# Patient Record
Sex: Female | Born: 1940 | Race: White | Hispanic: No | Marital: Married | Smoking: Former smoker
Health system: Southern US, Community
[De-identification: ages and names within clinical notes are randomized; demographics above are authoritative.]

## PROBLEM LIST (undated history)

## (undated) DIAGNOSIS — I10 Essential (primary) hypertension: Secondary | ICD-10-CM

## (undated) DIAGNOSIS — E119 Type 2 diabetes mellitus without complications: Secondary | ICD-10-CM

---

## 2007-08-20 ENCOUNTER — Ambulatory Visit: Payer: Self-pay | Admitting: Gastroenterology

## 2007-09-15 ENCOUNTER — Other Ambulatory Visit: Payer: Self-pay

## 2007-09-15 ENCOUNTER — Inpatient Hospital Stay: Payer: Self-pay | Admitting: Specialist

## 2007-09-29 ENCOUNTER — Ambulatory Visit: Payer: Self-pay | Admitting: Vascular Surgery

## 2008-08-09 ENCOUNTER — Ambulatory Visit: Payer: Self-pay | Admitting: Gastroenterology

## 2009-05-15 ENCOUNTER — Inpatient Hospital Stay: Payer: Self-pay | Admitting: Internal Medicine

## 2009-06-01 ENCOUNTER — Inpatient Hospital Stay: Payer: Self-pay | Admitting: Internal Medicine

## 2009-06-01 ENCOUNTER — Ambulatory Visit: Payer: Self-pay

## 2009-08-05 ENCOUNTER — Inpatient Hospital Stay: Payer: Self-pay | Admitting: *Deleted

## 2009-08-17 ENCOUNTER — Inpatient Hospital Stay: Payer: Self-pay | Admitting: Internal Medicine

## 2010-05-24 ENCOUNTER — Ambulatory Visit: Payer: Self-pay | Admitting: Oncology

## 2010-06-15 ENCOUNTER — Ambulatory Visit: Payer: Self-pay | Admitting: Internal Medicine

## 2011-06-07 ENCOUNTER — Ambulatory Visit: Payer: Self-pay

## 2011-06-07 LAB — CREATININE, SERUM: EGFR (African American): 57 — ABNORMAL LOW

## 2011-06-27 ENCOUNTER — Ambulatory Visit: Payer: Self-pay | Admitting: Internal Medicine

## 2011-07-28 ENCOUNTER — Ambulatory Visit: Payer: Self-pay | Admitting: Internal Medicine

## 2011-10-16 ENCOUNTER — Encounter: Payer: Self-pay | Admitting: Nurse Practitioner

## 2011-10-16 ENCOUNTER — Encounter: Payer: Self-pay | Admitting: Cardiothoracic Surgery

## 2011-10-28 ENCOUNTER — Encounter: Payer: Self-pay | Admitting: Cardiothoracic Surgery

## 2011-10-28 ENCOUNTER — Encounter: Payer: Self-pay | Admitting: Nurse Practitioner

## 2012-04-25 ENCOUNTER — Ambulatory Visit: Payer: Self-pay | Admitting: Orthopedic Surgery

## 2012-04-25 LAB — BASIC METABOLIC PANEL
Anion Gap: 1 — ABNORMAL LOW (ref 7–16)
BUN: 15 mg/dL (ref 7–18)
Calcium, Total: 8.7 mg/dL (ref 8.5–10.1)
Chloride: 109 mmol/L — ABNORMAL HIGH (ref 98–107)
Creatinine: 0.83 mg/dL (ref 0.60–1.30)
EGFR (African American): >60
Glucose: 120 mg/dL — ABNORMAL HIGH (ref 65–99)
Osmolality: 285 (ref 275–301)
Sodium: 142 mmol/L (ref 136–145)

## 2012-06-30 ENCOUNTER — Ambulatory Visit: Payer: Self-pay

## 2012-06-30 LAB — CREATININE, SERUM
Creatinine: 1.03 mg/dL (ref 0.60–1.30)
EGFR (African American): >60
EGFR (Non-African Amer.): 55 — ABNORMAL LOW

## 2012-07-01 ENCOUNTER — Ambulatory Visit: Payer: Self-pay | Admitting: Internal Medicine

## 2012-07-27 ENCOUNTER — Ambulatory Visit: Payer: Self-pay | Admitting: Internal Medicine

## 2012-08-28 ENCOUNTER — Encounter: Payer: Self-pay | Admitting: Cardiothoracic Surgery

## 2012-08-28 ENCOUNTER — Encounter: Payer: Self-pay | Admitting: Nurse Practitioner

## 2012-09-03 LAB — WOUND CULTURE

## 2012-09-14 ENCOUNTER — Emergency Department: Payer: Self-pay | Admitting: Emergency Medicine

## 2012-09-14 LAB — URINALYSIS, COMPLETE
Bilirubin,UR: NEGATIVE
Glucose,UR: NEGATIVE mg/dL
Ketone: NEGATIVE
Leukocyte Esterase: NEGATIVE
Nitrite: POSITIVE
Ph: 5
Protein: 30
RBC,UR: NONE SEEN /HPF
Specific Gravity: 1.021
Squamous Epithelial: 1
WBC UR: <1 /HPF

## 2012-09-14 LAB — LIPASE, BLOOD: Lipase: 76 U/L (ref 73–393)

## 2012-09-14 LAB — COMPREHENSIVE METABOLIC PANEL
Albumin: 3.2 g/dL — ABNORMAL LOW (ref 3.4–5.0)
Anion Gap: 7 (ref 7–16)
Bilirubin,Total: 0.4 mg/dL (ref 0.2–1.0)
Calcium, Total: 8.9 mg/dL (ref 8.5–10.1)
Chloride: 106 mmol/L (ref 98–107)
Co2: 28 mmol/L (ref 21–32)
Creatinine: 1.19 mg/dL (ref 0.60–1.30)
EGFR (African American): 54 — ABNORMAL LOW
EGFR (Non-African Amer.): 46 — ABNORMAL LOW
Osmolality: 283 (ref 275–301)
Potassium: 4.3 mmol/L (ref 3.5–5.1)
SGOT(AST): 21 U/L (ref 15–37)

## 2012-09-14 LAB — CBC
HCT: 36 % (ref 35.0–47.0)
MCHC: 33.2 g/dL (ref 32.0–36.0)
Platelet: 200 10*3/uL (ref 150–440)
RDW: 13.5 % (ref 11.5–14.5)
WBC: 5.4 10*3/uL (ref 3.6–11.0)

## 2012-09-14 LAB — TROPONIN I: Troponin-I: <0.02 ng/mL

## 2012-09-16 LAB — URINE CULTURE

## 2012-09-26 ENCOUNTER — Encounter: Payer: Self-pay | Admitting: Cardiothoracic Surgery

## 2012-09-26 ENCOUNTER — Encounter: Payer: Self-pay | Admitting: Nurse Practitioner

## 2013-02-12 ENCOUNTER — Encounter: Payer: Self-pay | Admitting: Surgery

## 2013-02-26 ENCOUNTER — Encounter: Payer: Self-pay | Admitting: Surgery

## 2013-05-12 ENCOUNTER — Ambulatory Visit: Payer: Self-pay | Admitting: Family Medicine

## 2013-05-14 ENCOUNTER — Ambulatory Visit: Payer: Self-pay | Admitting: Physical Medicine and Rehabilitation

## 2013-05-26 ENCOUNTER — Ambulatory Visit: Payer: Self-pay | Admitting: Family Medicine

## 2013-10-21 ENCOUNTER — Encounter: Payer: Self-pay | Admitting: Surgery

## 2013-10-27 ENCOUNTER — Encounter: Payer: Self-pay | Admitting: Surgery

## 2013-11-10 LAB — WOUND AEROBIC CULTURE

## 2013-11-21 LAB — URINALYSIS, COMPLETE
BACTERIA: NONE SEEN
Bilirubin,UR: NEGATIVE
Blood: NEGATIVE
GLUCOSE, UR: NEGATIVE mg/dL (ref 0–75)
KETONE: NEGATIVE
Leukocyte Esterase: NEGATIVE
Nitrite: NEGATIVE
PH: 5 (ref 4.5–8.0)
PROTEIN: NEGATIVE
Specific Gravity: 1.028 (ref 1.003–1.030)
Squamous Epithelial: NONE SEEN

## 2013-11-21 LAB — CBC WITH DIFFERENTIAL/PLATELET
Basophil #: 0.1 10*3/uL (ref 0.0–0.1)
Basophil %: 0.6 %
EOS PCT: 2.3 %
Eosinophil #: 0.2 10*3/uL (ref 0.0–0.7)
HCT: 41.5 % (ref 35.0–47.0)
HGB: 13.4 g/dL (ref 12.0–16.0)
Lymphocyte #: 1.1 10*3/uL (ref 1.0–3.6)
Lymphocyte %: 11.4 %
MCH: 34.9 pg — ABNORMAL HIGH (ref 26.0–34.0)
MCHC: 32.2 g/dL (ref 32.0–36.0)
MCV: 109 fL — AB (ref 80–100)
Monocyte #: 0.7 x10 3/mm (ref 0.2–0.9)
Monocyte %: 7.5 %
NEUTROS ABS: 7.2 10*3/uL — AB (ref 1.4–6.5)
Neutrophil %: 78.2 %
Platelet: 209 10*3/uL (ref 150–440)
RBC: 3.83 10*6/uL (ref 3.80–5.20)
RDW: 17 % — AB (ref 11.5–14.5)
WBC: 9.2 10*3/uL (ref 3.6–11.0)

## 2013-11-21 LAB — PROTIME-INR
INR: 1
Prothrombin Time: 12.6 secs (ref 11.5–14.7)

## 2013-11-21 LAB — COMPREHENSIVE METABOLIC PANEL
ALK PHOS: 155 U/L — AB
ALT: 29 U/L
ANION GAP: 5 — AB (ref 7–16)
Albumin: 2.7 g/dL — ABNORMAL LOW (ref 3.4–5.0)
BILIRUBIN TOTAL: 0.4 mg/dL (ref 0.2–1.0)
BUN: 24 mg/dL — AB (ref 7–18)
CO2: 30 mmol/L (ref 21–32)
Calcium, Total: 8.1 mg/dL — ABNORMAL LOW (ref 8.5–10.1)
Chloride: 105 mmol/L (ref 98–107)
Creatinine: 1.49 mg/dL — ABNORMAL HIGH (ref 0.60–1.30)
EGFR (African American): 44 — ABNORMAL LOW
GFR CALC NON AF AMER: 37 — AB
Glucose: 75 mg/dL (ref 65–99)
Osmolality: 282 (ref 275–301)
POTASSIUM: 4.3 mmol/L (ref 3.5–5.1)
SGOT(AST): 34 U/L (ref 15–37)
Sodium: 140 mmol/L (ref 136–145)
TOTAL PROTEIN: 6.5 g/dL (ref 6.4–8.2)

## 2013-11-21 LAB — TROPONIN I: Troponin-I: <0.02 ng/mL

## 2013-11-21 LAB — MAGNESIUM: Magnesium: 1.5 mg/dL — ABNORMAL LOW

## 2013-11-21 LAB — PHOSPHORUS: PHOSPHORUS: 3.4 mg/dL (ref 2.5–4.9)

## 2013-11-22 ENCOUNTER — Observation Stay: Payer: Self-pay | Admitting: Internal Medicine

## 2013-11-22 LAB — TSH: Thyroid Stimulating Horm: 0.163 u[IU]/mL — ABNORMAL LOW

## 2013-11-23 LAB — BASIC METABOLIC PANEL
Anion Gap: 4 — ABNORMAL LOW (ref 7–16)
BUN: 14 mg/dL (ref 7–18)
Calcium, Total: 7.2 mg/dL — ABNORMAL LOW (ref 8.5–10.1)
Chloride: 112 mmol/L — ABNORMAL HIGH (ref 98–107)
Co2: 26 mmol/L (ref 21–32)
Creatinine: 1.04 mg/dL (ref 0.60–1.30)
EGFR (African American): >60
EGFR (Non-African Amer.): 56 — ABNORMAL LOW
Glucose: 144 mg/dL — ABNORMAL HIGH (ref 65–99)
Osmolality: 286 (ref 275–301)
Potassium: 4.5 mmol/L (ref 3.5–5.1)
Sodium: 142 mmol/L (ref 136–145)

## 2013-11-23 LAB — CBC WITH DIFFERENTIAL/PLATELET
Basophil #: 0 10*3/uL (ref 0.0–0.1)
Basophil %: 0.7 %
Eosinophil #: 0.2 10*3/uL (ref 0.0–0.7)
Eosinophil %: 4.2 %
HCT: 29.5 % — ABNORMAL LOW (ref 35.0–47.0)
HGB: 9.3 g/dL — ABNORMAL LOW (ref 12.0–16.0)
Lymphocyte #: 1.4 10*3/uL (ref 1.0–3.6)
Lymphocyte %: 28.7 %
MCH: 34.2 pg — ABNORMAL HIGH (ref 26.0–34.0)
MCHC: 31.5 g/dL — ABNORMAL LOW (ref 32.0–36.0)
MCV: 109 fL — ABNORMAL HIGH (ref 80–100)
Monocyte #: 0.4 x10 3/mm (ref 0.2–0.9)
Monocyte %: 9 %
Neutrophil #: 2.8 10*3/uL (ref 1.4–6.5)
Neutrophil %: 57.4 %
Platelet: 139 10*3/uL — ABNORMAL LOW (ref 150–440)
RBC: 2.72 10*6/uL — ABNORMAL LOW (ref 3.80–5.20)
RDW: 16.9 % — ABNORMAL HIGH (ref 11.5–14.5)
WBC: 4.9 10*3/uL (ref 3.6–11.0)

## 2013-11-23 LAB — URINE CULTURE

## 2013-11-23 LAB — IRON AND TIBC
Iron Bind.Cap.(Total): 141 ug/dL — ABNORMAL LOW (ref 250–450)
Iron Saturation: 39 %
Iron: 55 ug/dL (ref 50–170)
UNBOUND IRON-BIND. CAP.: 86 ug/dL

## 2013-11-23 LAB — FERRITIN: FERRITIN (ARMC): 64 ng/mL (ref 8–388)

## 2013-11-23 LAB — FOLATE: Folic Acid: 3.1 ng/mL (ref 3.1–100.0)

## 2013-11-23 LAB — HEMOGLOBIN A1C: Hemoglobin A1C: 5.4 % (ref 4.2–6.3)

## 2013-11-23 LAB — TSH: Thyroid Stimulating Horm: 0.433 u[IU]/mL — ABNORMAL LOW

## 2013-11-24 LAB — BASIC METABOLIC PANEL
ANION GAP: 4 — AB (ref 7–16)
BUN: 8 mg/dL (ref 7–18)
CREATININE: 0.81 mg/dL (ref 0.60–1.30)
Calcium, Total: 6.7 mg/dL — CL (ref 8.5–10.1)
Chloride: 115 mmol/L — ABNORMAL HIGH (ref 98–107)
Co2: 25 mmol/L (ref 21–32)
Glucose: 108 mg/dL — ABNORMAL HIGH (ref 65–99)
Osmolality: 286 (ref 275–301)
Potassium: 4 mmol/L (ref 3.5–5.1)
SODIUM: 144 mmol/L (ref 136–145)

## 2013-11-24 LAB — CBC WITH DIFFERENTIAL/PLATELET
BASOS ABS: 0 10*3/uL (ref 0.0–0.1)
Basophil %: 0.7 %
Eosinophil #: 0.2 10*3/uL (ref 0.0–0.7)
Eosinophil %: 3.7 %
HCT: 28.8 % — ABNORMAL LOW (ref 35.0–47.0)
HGB: 9.4 g/dL — AB (ref 12.0–16.0)
LYMPHS ABS: 1.4 10*3/uL (ref 1.0–3.6)
LYMPHS PCT: 24.9 %
MCH: 35.4 pg — ABNORMAL HIGH (ref 26.0–34.0)
MCHC: 32.8 g/dL (ref 32.0–36.0)
MCV: 108 fL — AB (ref 80–100)
MONOS PCT: 9.1 %
Monocyte #: 0.5 x10 3/mm (ref 0.2–0.9)
Neutrophil #: 3.5 10*3/uL (ref 1.4–6.5)
Neutrophil %: 61.6 %
PLATELETS: 150 10*3/uL (ref 150–440)
RBC: 2.67 10*6/uL — ABNORMAL LOW (ref 3.80–5.20)
RDW: 16.8 % — ABNORMAL HIGH (ref 11.5–14.5)
WBC: 5.7 10*3/uL (ref 3.6–11.0)

## 2013-11-26 ENCOUNTER — Encounter: Payer: Self-pay | Admitting: Surgery

## 2013-11-26 LAB — CULTURE, BLOOD (SINGLE)

## 2013-12-10 ENCOUNTER — Ambulatory Visit: Payer: Self-pay | Admitting: Family Medicine

## 2013-12-27 ENCOUNTER — Encounter: Payer: Self-pay | Admitting: Surgery

## 2014-01-26 ENCOUNTER — Encounter: Payer: Self-pay | Admitting: Surgery

## 2014-01-29 ENCOUNTER — Encounter: Payer: Self-pay | Admitting: General Surgery

## 2014-02-16 LAB — WOUND AEROBIC CULTURE

## 2014-03-01 ENCOUNTER — Inpatient Hospital Stay: Payer: Self-pay | Admitting: Internal Medicine

## 2014-03-01 LAB — URINALYSIS, COMPLETE
BILIRUBIN, UR: NEGATIVE
BILIRUBIN, UR: NEGATIVE
Bilirubin,UR: NEGATIVE
GLUCOSE, UR: NEGATIVE mg/dL (ref 0–75)
Glucose,UR: NEGATIVE mg/dL (ref 0–75)
Glucose,UR: NEGATIVE mg/dL (ref 0–75)
KETONE: NEGATIVE
Ketone: NEGATIVE
Ketone: NEGATIVE
NITRITE: NEGATIVE
NITRITE: NEGATIVE
Nitrite: NEGATIVE
Ph: 5 (ref 4.5–8.0)
Ph: 5 (ref 4.5–8.0)
Ph: 5 (ref 4.5–8.0)
Protein: NEGATIVE
Protein: 30
Protein: 30
RBC,UR: 48 /HPF (ref 0–5)
RBC,UR: 72 /HPF (ref 0–5)
SPECIFIC GRAVITY: 1.014 (ref 1.003–1.030)
Specific Gravity: 1.024 (ref 1.003–1.030)
Specific Gravity: 1.013 (ref 1.003–1.030)
Squamous Epithelial: 9
Squamous Epithelial: NONE SEEN
WBC UR: 202 /HPF (ref 0–5)

## 2014-03-01 LAB — CBC
HCT: 37 % (ref 35.0–47.0)
HGB: 11.6 g/dL — ABNORMAL LOW (ref 12.0–16.0)
MCH: 32.4 pg (ref 26.0–34.0)
MCHC: 31.4 g/dL — ABNORMAL LOW (ref 32.0–36.0)
MCV: 103 fL — AB (ref 80–100)
Platelet: 326 10*3/uL (ref 150–440)
RBC: 3.59 10*6/uL — ABNORMAL LOW (ref 3.80–5.20)
RDW: 16.1 % — ABNORMAL HIGH (ref 11.5–14.5)
WBC: 13.8 10*3/uL — ABNORMAL HIGH (ref 3.6–11.0)

## 2014-03-01 LAB — COMPREHENSIVE METABOLIC PANEL
ALK PHOS: 170 U/L — AB
ALT: 40 U/L
AST: 52 U/L — AB (ref 15–37)
Albumin: 2.2 g/dL — ABNORMAL LOW (ref 3.4–5.0)
Anion Gap: 7 (ref 7–16)
BUN: 36 mg/dL — AB (ref 7–18)
Bilirubin,Total: 0.5 mg/dL (ref 0.2–1.0)
CO2: 27 mmol/L (ref 21–32)
CREATININE: 1.4 mg/dL — AB (ref 0.60–1.30)
Calcium, Total: 8.2 mg/dL — ABNORMAL LOW (ref 8.5–10.1)
Chloride: 106 mmol/L (ref 98–107)
EGFR (African American): 48 — ABNORMAL LOW
EGFR (Non-African Amer.): 39 — ABNORMAL LOW
Glucose: 92 mg/dL (ref 65–99)
Osmolality: 287 (ref 275–301)
Potassium: 4.9 mmol/L (ref 3.5–5.1)
Sodium: 140 mmol/L (ref 136–145)
Total Protein: 6 g/dL — ABNORMAL LOW (ref 6.4–8.2)

## 2014-03-01 LAB — TROPONIN I: Troponin-I: <0.02 ng/mL

## 2014-03-02 LAB — TSH: Thyroid Stimulating Horm: 0.016 u[IU]/mL — ABNORMAL LOW

## 2014-03-02 LAB — HEMOGLOBIN A1C: Hemoglobin A1C: 5.3 % (ref 4.2–6.3)

## 2014-03-03 LAB — URINALYSIS, COMPLETE
Bilirubin,UR: NEGATIVE
GLUCOSE, UR: NEGATIVE mg/dL (ref 0–75)
KETONE: NEGATIVE
Nitrite: NEGATIVE
PROTEIN: NEGATIVE
Ph: 5 (ref 4.5–8.0)
Specific Gravity: 1.012 (ref 1.003–1.030)
Squamous Epithelial: 1

## 2014-03-03 LAB — T4, FREE: FREE THYROXINE: 1.24 ng/dL (ref 0.76–1.46)

## 2014-03-03 LAB — CREATININE, SERUM
Creatinine: 0.87 mg/dL (ref 0.60–1.30)
EGFR (African American): >60
EGFR (Non-African Amer.): >60

## 2014-03-04 LAB — CBC WITH DIFFERENTIAL/PLATELET
BASOS PCT: 0.6 %
Basophil #: 0 10*3/uL (ref 0.0–0.1)
Eosinophil #: 0.2 10*3/uL (ref 0.0–0.7)
Eosinophil %: 2.3 %
HCT: 28.5 % — AB (ref 35.0–47.0)
HGB: 9.3 g/dL — ABNORMAL LOW (ref 12.0–16.0)
Lymphocyte #: 1 10*3/uL (ref 1.0–3.6)
Lymphocyte %: 13.8 %
MCH: 33.4 pg (ref 26.0–34.0)
MCHC: 32.7 g/dL (ref 32.0–36.0)
MCV: 102 fL — AB (ref 80–100)
MONOS PCT: 11.9 %
Monocyte #: 0.9 x10 3/mm (ref 0.2–0.9)
NEUTROS ABS: 5.4 10*3/uL (ref 1.4–6.5)
Neutrophil %: 71.4 %
PLATELETS: 201 10*3/uL (ref 150–440)
RBC: 2.79 10*6/uL — ABNORMAL LOW (ref 3.80–5.20)
RDW: 15.4 % — AB (ref 11.5–14.5)
WBC: 7.5 10*3/uL (ref 3.6–11.0)

## 2014-03-04 LAB — BASIC METABOLIC PANEL WITH GFR
Anion Gap: 6 — ABNORMAL LOW
BUN: 13 mg/dL
Calcium, Total: 8 mg/dL — ABNORMAL LOW
Chloride: 110 mmol/L — ABNORMAL HIGH
Co2: 26 mmol/L
Creatinine: 0.95 mg/dL
EGFR (African American): >60
EGFR (Non-African Amer.): >60
Glucose: 195 mg/dL — ABNORMAL HIGH
Osmolality: 289
Potassium: 3.8 mmol/L
Sodium: 142 mmol/L

## 2014-03-05 LAB — CREATININE, SERUM
CREATININE: 0.87 mg/dL (ref 0.60–1.30)
EGFR (Non-African Amer.): >60

## 2014-03-06 LAB — CBC WITH DIFFERENTIAL/PLATELET
BASOS PCT: 0.8 %
Basophil #: 0.1 10*3/uL (ref 0.0–0.1)
EOS PCT: 6.2 %
Eosinophil #: 0.4 10*3/uL (ref 0.0–0.7)
HCT: 28.6 % — ABNORMAL LOW (ref 35.0–47.0)
HGB: 9.4 g/dL — AB (ref 12.0–16.0)
Lymphocyte #: 1.9 10*3/uL (ref 1.0–3.6)
Lymphocyte %: 27.7 %
MCH: 33.1 pg (ref 26.0–34.0)
MCHC: 32.7 g/dL (ref 32.0–36.0)
MCV: 101 fL — ABNORMAL HIGH (ref 80–100)
MONOS PCT: 10.8 %
Monocyte #: 0.8 x10 3/mm (ref 0.2–0.9)
NEUTROS PCT: 54.5 %
Neutrophil #: 3.8 10*3/uL (ref 1.4–6.5)
PLATELETS: 173 10*3/uL (ref 150–440)
RBC: 2.83 10*6/uL — AB (ref 3.80–5.20)
RDW: 15.5 % — ABNORMAL HIGH (ref 11.5–14.5)
WBC: 7 10*3/uL (ref 3.6–11.0)

## 2014-03-06 LAB — BASIC METABOLIC PANEL
ANION GAP: 6 — AB (ref 7–16)
BUN: 8 mg/dL (ref 7–18)
CO2: 29 mmol/L (ref 21–32)
Calcium, Total: 8.2 mg/dL — ABNORMAL LOW (ref 8.5–10.1)
Chloride: 111 mmol/L — ABNORMAL HIGH (ref 98–107)
Creatinine: 0.9 mg/dL (ref 0.60–1.30)
EGFR (Non-African Amer.): >60
Glucose: 104 mg/dL — ABNORMAL HIGH (ref 65–99)
Osmolality: 289 (ref 275–301)
Potassium: 3.6 mmol/L (ref 3.5–5.1)
Sodium: 146 mmol/L — ABNORMAL HIGH (ref 136–145)

## 2014-03-06 LAB — CULTURE, BLOOD (SINGLE)

## 2014-03-06 LAB — URINE CULTURE

## 2014-03-08 LAB — WOUND CULTURE

## 2014-06-18 NOTE — Op Note (Signed)
PATIENT NAME:  Kathy [name], Kathy [name] MR#:  [id number] DATE OF BIRTH:  Mar 15, 1940  DATE OF PROCEDURE:  04/25/2012  PREOPERATIVE DIAGNOSIS: Left displaced distal radius fracture, comminuted, and carpal tunnel syndrome.   POSTOPERATIVE DIAGNOSIS: Left displaced distal radius fracture, comminuted, and carpal tunnel syndrome.   PROCEDURE: Open reduction internal fixation of left distal radius and carpal tunnel release.   SURGEON: Leitha SchullerMichael J. Menz, MD   ANESTHESIA:  General.  DESCRIPTION OF PROCEDURE: The patient was brought to the operating room and, after adequate anesthesia was obtained, the left arm was prepped and draped in the usual sterile fashion. After patient identification and timeout procedures were completed, the tourniquet was raised to 250 mmHg with fingertrap traction applied to the index and middle fingers. [name] volar approach was made to the distal radius with incision centered over the FCR tendon. The tendon was then retracted radially and the deep fascia incised with the foot pronator identified and elevated off the distal radius. The fracture site was exposed and with fluoroscopic examination, with the traction applied and flexion, near anatomic alignment could be obtained of the multiple fragments. [name] narrow short DVR plate was then applied and manipulated to be at the appropriate position. The proximal cortical screws were placed to lock the plate against the shaft, in the appropriate alignment. Following this the distal holes were filled with drilling, measuring and placing multidirectional screws and smooth peg to make sure there was no penetration into the joint. Careful fluoroscopic examination was taken with oblique views down the radial styloid as well as along the volar tilt and there did not appear to be any penetration into the joint. After all the holes had been filled, the traction was released and the fracture appeared stable through range of motion. At this point, the carpal tunnel  release was carried out with incision in the palm, approximately 2 cm in length, in line with the ring metacarpal. The skin and subcutaneous tissue was spread and the transverse carpal ligament identified and incised. [name] hemostat was placed underneath the transverse carpal ligament to protect the underlying structures and careful release was carried out proximally and distally. There did appear to be chronic compression, in the mid and proximal portion of the carpal tunnel. After release there was good vascular blush to the nerve, although it did have some slight hourglass constriction from chronic compression.  At this point, the wounds were thoroughly irrigated and tourniquet was let down. Wounds were closed with 2-0 Vicryl subcutaneously, in the forearm incision, and just 4-0 nylon for the incisions, with simple interrupted closure. Xeroform, 4 x 4's, Webril and [name] volar splint were applied along with an Ace wrap. The patient was sent to the recovery room in stable condition.   ESTIMATED BLOOD LOSS: Minimal.   COMPLICATIONS: None.   SPECIMEN: None.   IMPLANTS: Left short narrow DVR plate with multiple pegs and screws.   TOURNIQUET TIME: 35 minutes at 250 mmHg. ____________________________ Leitha SchullerMichael J. Menz, MD mjm:sb D: 04/30/2012 07:00:58 ET T: 04/30/2012 07:24:46 ET JOB#: [id number]  cc: Leitha SchullerMichael J. Menz, MD, <Dictator> Leitha SchullerMICHAEL J MENZ MD ELECTRONICALLY SIGNED 04/30/2012 8:16

## 2014-06-19 NOTE — H&P (Signed)
PATIENT NAME:  Kathy [name], Kathy [name] MR#:  [id number] DATE OF BIRTH:  1940/06/04  DATE OF ADMISSION:  11/22/2013  REFERRING PHYSICIAN: Charlestine NightPhillip [name]. Kathy CourtStafford, MD  PRIMARY CARE DOCTOR: Kathy Iraniavid M. Kathy HartBronstein, MD   ADMISSION DIAGNOSIS: Multiple wounds of the skin and weakness.   HISTORY OF PRESENT ILLNESS: This is [name] 74 year old Caucasian female who presents to the Emergency Department with worsening weakness and inability to walk. The patient states that she wants to stop having to depend on her husband, who cannot lift her. She is also concerned that her wounds simply are not healing and that her overall state of health seems to be worsening. She admits that her appetite has been poor. She does not eat well because she is afraid she will throw up, but she denies any nausea or vomiting today. She also denies pain. When she called EMS, she was found to have [name] fingerstick blood sugar of 28. Her husband states that he has not given her any insulin or any of her oral diabetic medicines today. The patient admits that at that time she felt sweaty and cold. She still admits to feeling cold. After multiple attempts to improve her blood sugar, the patient required IV drips containing glucose, which prompted the Emergency Department staff to call for admission.  REVIEW OF SYSTEMS:  CONSTITUTIONAL: The patient denies fever but admits to weakness.  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 She denies any eye pain.  ENT: The patient denies tinnitus or difficulty swallowing.  RESPIRATORY: The patient denies cough or shortness of breath.  CARDIOVASCULAR: The patient denies chest pain, orthopnea, or palpitations.  GASTROINTESTINAL: The patient admits to intermittent nausea and vomiting, but denies diarrhea or abdominal pain.  GENITOURINARY: The patient denies dysuria or increased frequency, but admits to incontinence at times.  ENDOCRINE: The patient denies polyuria or nocturia.  HEMATOLOGIC AND LYMPHATIC: The patient  denies easy bruising or bleeding, but admits to [name] history of anemia.  INTEGUMENTARY: The patient admits to multiple wounds on her legs. She denies any rashes at this time.  MUSCULOSKELETAL: The patient denies myalgias or arthralgias.  NEUROLOGIC: The patient denies numbness in her extremities or difficulty speaking.  PSYCHIATRIC: The patient denies depression or suicidal ideation.   PAST MEDICAL HISTORY: Diabetes type 2, chronic kidney disease, nonhealing wounds, blindness due to glaucoma, morbid obesity, hypertension, osteoarthritis, as well as [name] remote history of [name] suprasellar mass, ischemic colitis, as well as nephrolithiasis.   PAST SURGICAL HISTORY: The patient has had multiple back surgeries as well as [name] cholecystectomy, right eye removal for glaucoma, surgery for trigeminal neuralgia.   FAMILY HISTORY: Hypertension, diabetes type 2, as well as multiple family members with brain tumors, some of whom are deceased due to that condition.   SOCIAL HISTORY: The patient is [name] former smoker. She denies alcohol or drug use. She is married. Her husband is her primary caregiver.  HOME MEDICATIONS: 1.  Afrin 0.05% nasal spray, 2 sprays to each nostril 2 times [name] day as needed.  2.  Amlodipine 2.5 mg 1 tablet p.o. daily.  3.  Aspirin 81 mg 1 tablet p.o. daily.  4.  Carbamazepine 200 mg 1/2 tablet p.o. 4 times [name] day.  5.  Cipro 500 mg 1 tablet p.o. every 12 hours.  6.  Gabapentin 300 mg 2 capsules p.o. t.i.d.  7.  Glyburide with metformin 5 mg/500 mg 1 tab p.o. b.i.d.  8.  Hydromorphone 4 mg 1 tablet p.o. b.i.d.  9.  Ibuprofen 200 mg  1 tablet p.o. every 4 hours as needed.  10.  Lasix 40 mg 1 tab p.o. daily as needed.  11.  Levothyroxine 100 mcg 1 tablet p.o. daily.  12.  Lipitor 80 mg 1 tab p.o. at bedtime.  13.  Meloxicam 15 mg 1 tab p.o. daily.  14.  Metoprolol tartrate 50 mg 1 tab p.o. daily.  15.  Montelukast 10 mg 1 tab p.o. daily.  16.  Valtrex 1 g 1 tablet p.o. t.i.d.  17.  Vitamin D2,  50,000 international units 1 capsule p.o. every other week.   ALLERGIES: No known drug allergies.  PERTINENT LABORATORY RESULTS AND RADIOGRAPHIC FINDINGS: Point-of-care glucose 71 initially then 46. Serum glucose at the time of blood draw 75. BUN 24, creatinine 1.49, sodium 140, potassium 4.3, chloride 105, bicarbonate 30, anion gap is 5, just below normal. Calcium is 8.1, magnesium is 1.5, phosphorus is 3.4, serum albumin is 2.7, alkaline phosphatase 155, AST 34, ALT 29. Troponin is negative. White blood cell count is normal. Hemoglobin is 13.4, hematocrit is 41.5, MCV is 109. INR is 1. Urinalysis is negative for infection. Venous lactic acid is 2.5. Chest x-ray shows no active cardiopulmonary disease.  PHYSICAL EXAMINATION:  VITAL SIGNS: Temperature is 97.6, pulse 69, respirations 23, blood pressure 121/99, pulse oximetry 98% on room air.  GENERAL: Alert and oriented x 3, in no apparent distress.  HEENT: Normocephalic, atraumatic.  EYES: Field. Mucous membranes are slightly tacky.  NECK: Trachea is midline. No adenopathy.  CHEST: Symmetric and atraumatic.  CARDIOVASCULAR: Regular rate and rhythm. Normal S1, S2. No rubs, clicks, or murmurs appreciated.  LUNGS: Clear to auscultation bilaterally. Normal effort and excursion.  ABDOMEN: Positive bowel sounds. Soft, nontender, nondistended. No hepatosplenomegaly.  GENITOURINARY: Foley is in place.  MUSCULOSKELETAL: The patient moves all 4 extremities. However, strength testing is inconsistent given poor cooperation secondary to pain.  SKIN: The patient has multiple ulcers in various stages of healing, most notably one that is rectangular shaped on her right lower anterior leg that is covered with Xeroform dressing. She also has what appear to be healing ulcers that have eschar. She also has 2 large ulcers on the inside of her thighs, the dimensions of which I was not fully able to appreciate as I could not completely abduct the patient's legs. She  reportedly also has [name] wound on her left buttock that is clean and dressed, but I have been unable to roll the patient to fully visualize that. The patient has large areas of erythema under her breasts and in the intertriginous areas of her pannus that appear to be consistent with candidal rashes, some of which have some honey crusting, indicating super infection. There are also areas of reddened, scaly skin up to her shins bilaterally that are consistent with venous stasis dermatitis.  EXTREMITIES: No clubbing or cyanosis, but the patient does have nonpitting, nontense edema of both of her lower extremities, essentially up to the knees.  NEUROLOGIC: Cranial nerves V through XII appear to be grossly intact. The patient states her sense of smell is also intact.  PSYCHIATRIC: Mood is normal. Affect is congruent.   ASSESSMENT AND PLAN: This is [name] 74 year old female admitted for hypoglycemia and weakness. 1.  Hypoglycemia. The patient does not meet criteria for persistent or postprandial hypoglycemia, as she admits that she has not been eating. She denies any abdominal pain or nausea at this time, but apparently she has had some difficulty with both in the past. I suspect this may be  secondary to some gastroparesis secondary to unmanaged diabetes. Right now she is hypoglycemic for multiple reasons, including poor appetite or poor eating habits as well as lack of availability of prepared meals due to her and her husband's multiple comorbidities. The patient's blood sugar was initially 28. It improved after some D50, but then declined to approximately 70. The patient was placed on D5W in the Emergency Department that was not consistently running during her evaluation in the department. Her sugar then dropped to 46. I have ordered 1/2 of an amp of D50 and will place the patient on D5 and half normal saline with 20 of potassium at maintenance. 2.  Weakness. Etiology is multifactorial, including worsening chronic kidney  disease, probably. I will replete the patient's magnesium. I have ordered physical therapy and occupational therapy for deconditioning as well.  3.  Multiple wounds. These consist of venous stasis ulcers, superinfected candidal rashes, as well as pressure ulcers secondary to obesity and decreased mobility. The patient has no signs or symptoms of sepsis at this time. She also does not have leukocytosis. I have ordered wound care management for possible Unna boot placement, as well. I will also order elevation of the patient's legs and encourage ambulation.  4.  Chronic kidney disease. Stage 3. We will plan to hydrate the patient. I have also discontinued all of the nonsteroidal anti-inflammatory drugs the patient was taking, as well as Lasix and Valtrex, as there do not appear to be any active lesions at this time.  5.  Diabetes type 2. The patient reportedly has not received any oral hypoglycemics today. I will still hold her sulfonylureas at this time and I will start the patient on sliding scale insulin when her blood sugar improves. I have also encouraged her to eat well by mouth.  6.  Hypertension. Continue amlodipine and metoprolol.  7.  Morbid obesity. Obviously, the patient needs to have [name] balanced diet that is calorie-reduced and nutritious. [name] dietary consult may be necessary to guide her when she is discharged from the hospital.  8.  Deep vein thrombosis prophylaxis. Compression stockings and heparin.  9.  Gastrointestinal prophylaxis is unnecessary, as the patient is not critically ill.  CODE STATUS: The patient is [name] full code.  TIME SPENT: On admission orders and patient care: Approximately 45 minutes.    ____________________________ Kelton Pillar. Sheryle Hail, MD msd:ST D: 11/22/2013 01:48:27 ET T: 11/22/2013 02:22:26 ET JOB#: [id number]  cc: Kelton Pillar. Sheryle Hail, MD, <Dictator> Kelton Pillar Debrah Granderson MD ELECTRONICALLY SIGNED 11/23/2013 23:50

## 2014-06-19 NOTE — Discharge Summary (Signed)
PATIENT NAME:  [name], [name] MR#:  [id number] DATE OF BIRTH:  Jul 05, 1940  DATE OF ADMISSION:  11/22/2013 DATE OF DISCHARGE:  11/24/2013  ADMITTING DIAGNOSIS: Weakness.   DISCHARGE DIAGNOSES:  1. Weakness due to hypoglycemia, likely due to poor p.o. intake, as well as oral diabetic treatment. Blood sugar now stable. The patient will not be discharged on oral treatment, will be on sliding scale.  2. Multiple wounds, felt to be due to venous stasis ulcers. Seen by wound care nurse and recommended continued wound care.  3. Chronic kidney disease.  4. Generalized deconditioning and weakness. The patient needs further physical therapy and rehabilitation.  5. Hypertension.  6. Morbid obesity. 7. Blindness due to glaucoma. 8. Osteoarthritis.  9. History of suprasellar mass.  10. History of ischemic colitis. 11. History of nephrolithiasis.  12. Anemia felt to be due to anemia of chronic disease. 13. Status post multiple back surgeries, status post cholecystectomy, status post right eye removed for glaucoma, surgery for trigeminal neuralgia.   CONSULTANTS: Care management. Wound care nurse.   PERTINENT LABORATORIES AND EVALUATIONS: Urinalysis on admission was negative. Urine cultures no growth. Blood cultures, no growth.   WBC 9.2, hemoglobin 13.4, platelet count was 109,000. TSH 0.163. BMP: Glucose 75, BUN 24, creatinine 1.49, sodium 140, potassium 4.3, chloride 105, CO2 was 30, magnesium was 1.5. INR 1.0.   Blood cultures, no growth. Urine cultures, no growth.   Vitamin D 125 level is pending.   Cortisol level was 24.7, vitamin B-12 of 311. TSH 0.433. Iron level (Dictation Anomaly) . Ferritin 64. Iron saturation 39, iron serum 55.   HOSPITAL COURSE: Please refer to the history and physical done by the admitting physician. The patient is a 74 year old, white female, followed at the wound care clinic, who was brought into the ED with severe weakness, inability to walk. She was noted to have  severe hypoglycemia, with a blood sugar of 28. Due to this, she received IV dextrose, and the patient was admitted for further evaluation and treatment. The patient was monitored in the ICU and was kept on IV D10 with her blood sugars normalizing. She is on glipizide therapy at home, which was stopped along with metformin. Her blood sugars are stable. The patient does have chronic multiple wounds for which she goes to the wound clinic. She was placed on empiric antibiotics for this, as well. She will work with physical therapy and is unable to ambulate much, and needs aggressive physical therapy. At this time, she has been arranged for physical therapy.   DISCHARGE MEDICATIONS: Meloxicam 15 one tablet p.o. daily, Singulair 10 daily, levothyroxine 100 mcg 1 tablet p.o. daily, Lasix 40 one tablet p.o. daily as needed for swelling in the lower extremities, aspirin 81 one tablet p.o. daily, vitamin D2 50,000 international units once every week, Afrin 0.05% nasal spray 2 sprays to each nostril b.i.d., metoprolol tartrate 50 daily, gabapentin 300 two capsules t.i.d., carbamazepine 100 mg 4 times a day, ibuprofen 200 one tablet p.o. every 4 hours as needed, Valtrex 1 gram t.i.d., Lipitor 80 at bedtime, amlodipine 2.5 p.o. daily, Lyrica 150 one tablet p.o. b.i.d., hydromorphone 4 mg 1 tablet p.o. b.i.d., Augmentin 875/125 one tablet p.o. q.12 x 10 days, sliding scale insulin, magnesium oxide 400 one tablet p.o. b.i.d., calcium plus vitamin D 1 tablet p.o. b.i.d.   DRESSING CARE: The patient is to keep skin dry beneath the breasts and abdominal folds. Clean and dry, apply nystatin cream twice daily. Place DermaTherapy therapeutic Retail banker  Anomaly)  linen under the skin folds to  (Dictation Anomaly)  away moisture. Cleanse blisters to the bilateral ischium and right gluteal folds with normal saline and pat gently dry, apply Santyl ointment to right ischial wound one-eighth inch thickness, top with normal saline moist  gauze, secured by 4 x 4 tape, change daily. Apply Allevyn silicone border foam to left ischium and left buttocks, and would change every other day, p.r.n. soilage. Cleanse bilateral lower extremity with soap and water and pat gently dry. Apply Unna boot bilaterally lower extremity legs beginning just below the toes and up to just below the knees at the patellar notch, change twice weekly. Encourage ambulation.   DISCHARGE FOLLOW-UP: Wound care clinic in 3 to 5 days. Follow up with primary M.D. in 1 to 2 weeks.   DIET: Low sodium, low fat, low cholesterol, carbohydrate-controlled diet.   ACTIVITY: As tolerated with PT evaluation and treatment.    TIME SPENT: 45 minutes.     ____________________________ Lacie ScottsShreyang H. Allena KatzPatel, MD shp:JT D: 11/24/2013 12:14:55 ET T: 11/24/2013 12:54:23 ET JOB#: [id number]  cc: Lenford Beddow H. Allena KatzPatel, MD, <Dictator> Charise CarwinSHREYANG H Daira Hine MD ELECTRONICALLY SIGNED 12/06/2013 8:52

## 2014-06-27 NOTE — Consult Note (Signed)
Brief Consult Note: Diagnosis: right heel ulcer multiple decubitus ulcers, bedridden state.   Patient was seen by consultant.   Consult note dictated.   Recommend further assessment or treatment.   Discussed with Attending MD.   Comments: I see no reason to debride any of these ulcers.  Electronic Signatures: Natale LayBird, Jermayne Sweeney (MD)  (Signed 05-Jan-16 11:36)  Authored: Brief Consult Note   Last Updated: 05-Jan-16 11:36 by Natale LayBird, Dolph Tavano (MD)

## 2014-06-27 NOTE — H&P (Signed)
PATIENT NAME:  Kathy [name], Kathy [name] MR#:  [id number] DATE OF BIRTH:  [birth year]  DATE OF ADMISSION:  03/01/2014  REFERRING PHYSICIAN:  Lucrezia EuropeAllison Webster, MD    PRIMARY CARE PHYSICIAN:  Dorothey Basemanavid Bronstein, MD   ADMISSION DIAGNOSIS: Cellulitis of bilateral lower extremities.   HISTORY OF PRESENT ILLNESS: This is [name] 74 year old Caucasian female who presents to the Emergency Department after suffering [name] fall. The patient's husband states that he was trying to help her to the bedside commode at home, and the patient was unable to bear weight, as she was dressing herself.  She fell and apparently landed on her left hip but complains of right hip pain. The patient's husband, who is her primary care taker was unable to help her up, which prompted him to call for admission.  In the Emergency Department, the patient was found not to have any pelvic fractures or acute injuries. However, she does have multiple skin rashes and ulcers that are of [name] foul smell and are non-healing. The patient is overall in [name] very poor state of health.  Her husband is unable to take care of her and so the Emergency Department called for admission for wound care and plan for disposition and care at home.   REVIEW OF SYSTEMS: CONSTITUTIONAL: The patient denies fever, but admits to generalized weakness.  EYES: The patient is blind  ENT:  Denies tinnitus or sore throat, respiration.  RESPIRATORY: Denies cough or shortness of breath.  CARDIOVASCULAR: Denies chest pain or palpitations.  GASTROINTESTINAL: Denies nausea, vomiting, diarrhea, or abdominal pain.  GENITOURINARY: Denies increased frequency, hesitancy or urgency of urination.  ENDOCRINE: Denies polyuria, polydipsia, but admits to occasional episodes of hyperglycemia  HEMATOLOGIC AND LYMPHATIC: The patient denies easy bruising or bleeding.  INTEGUMENTARY: The patient admits to multiple rashes and wounds all over her body.  MUSCULOSKELETAL: The patient denies myalgias or arthralgias.   NEUROLOGIC: Denies numbness in her extremities or dysarthria.  PSYCHIATRIC: Denies depression or suicidal ideation.   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   PAST SURGICAL HISTORY: The patient has multiple back surgeries, cholecystectomy, right eye removal for glaucoma as well as surgery for trigeminal neuralgia.  FAMILY HISTORY:  Type 2 diabetes, hypertension, some brain tumors throughout members of the family.   SOCIAL HISTORY: The patient is [name] former smoker. She does not do any drugs or alcohol. Her husband is her primary caregiver but is unable to lift her in the event of falls, which she states are relatively frequent.   MEDICATIONS:  1.  Amlodipine 2.5 mg 1 tablet p.o. daily.  2.  Aspirin 81 mg 1 tab p.o. daily.  3.  Carbamazepine 200 mg 1-1/2 tablets p.o. 4 times per day.  4.  Gabapentin 300 mg 2 capsules p.o. t.i.d.  5.  Glyburide 5 mg 1 tablet p.o. b.i.d.  6.  Hydromorphone 4 mg 1 tablet p.o. b.i.d.  7.  Levothyroxine 100 mcg 1 tablet p.o. daily.  8.  Liothyronine 25 mcg 1 tablet p.o. daily (is unclear if the patient is on both of these thyroid medications).  9.  Lipitor 80 mg 1 tab p.o. at bedtime.  10. Meloxicam 15 mg 1 tab p.o. daily.  11. Metformin 500 mg 1 to 2 tablets p.o. b.i.d.  12. Metoprolol tartrate 50 mg 1/2 tablet p.o. b.i.d.  13. Montelukast 10 mg 1  tablet p.o. daily.  14. Vitamin D-2 at 50,000 international units 1 capsule p.o. once per week.   ALLERGIES: No known drug allergies.   PERTINENT LABORATORY RESULTS AND RADIOGRAPHIC FINDINGS: Serum glucose is 92, BUN 36, creatinine 1.40, serum sodium 140, serum potassium is 4.9, chloride is 106, bicarbonate 27, calcium 8.2, serum albumin is 2.2, alkaline phosphatase 170, AST 52, ALT 50.  Troponin is negative. White blood cell count is 13.8, hemoglobin is 11.6, hematocrit 37, platelet count 326,000, MCV 103. Urinalysis negative for infection. There are 2+ red blood cells seen in the urine specimen.  There is no imaging.   PHYSICAL EXAMINATION:  VITAL SIGNS: Temperature is 97.8, pulse 78, respirations 18, blood pressure 122/78, pulse oximetry is 98% on room air.  GENERAL: The patient is alert and oriented, in no apparent distress.  HEENT: Normocephalic, atraumatic. 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  NECK: Trachea is midline. No adenopathy. Thyroid is nonpalpable and nontender.  CHEST: Symmetric and atraumatic.  CARDIOVASCULAR: Regular rate and rhythm. Normal S1, S2. No rubs, clicks, or murmurs appreciated.  LUNGS: Clear to auscultation bilaterally. Normal effort and excursion.  ABDOMEN: Positive bowel sounds. Soft, nontender, nondistended. No hepatosplenomegaly.  GENITOURINARY: Normal external female genitalia.  MUSCULOSKELETAL: The patient moves all 4 extremities, although range of movement is decreased, as the patient states her legs feel heavy and she is also laying in the hospital bed which is contoured for comfort.   SKIN: The majority of the patient's skin is warm and dry; however, there are candidal rashes in the patient's groin, as well as on her chest. These rashes, superinfect some of the dry skin along the superior aspect of her lower extremities. On the posterior aspect of her lower extremities, she has [name] much more macerated stage I ulcers, some of which have skin tears. There is also [name] pressure ulcer on her left buttock that is currently dressed.  I have not removed the dressing on that wound.  On her right heel, the patient has [name] Stage II ulcer with weeping and infected tissue that appears to need some debridement.  EXTREMITIES: No clubbing or cyanosis, but the patient does have edema of her lower  extremities up to her knees.  NEUROLOGIC: Cranial nerves II through XII excluding IV and VI are grossly intact.  PSYCHIATRIC: The patient's mood is normal. Affect is congruent. She has excellent judgment and insight into her condition.   ASSESSMENT AND PLAN: This is [name] 74 year old female admitted for cellulitis of her bilateral lower extremities.  1.  Cellulitis. There are no signs or symptoms of sepsis. The patient's wounds however, clearly necessitate antibiotic coverage. We have obtained blood cultures and started vancomycin in light of her leukocytosis. I recommend some isosorbide ointment but it is currently not on formulary at this hospital. We should apply that to her right heel ulcer. I have also ordered pressure relieving boots and Dakin solution for chemical debridement of that Stage II ulcer on her right heel. I have placed [name] wound clinic consult as well.  2.  Weakness. This is likely multifactorial. The patient's husband is her primary caregiver and definitely needs help at home. We should obtain [name] discharge planning consult as well as physical therapy and occupational therapy assessments for deconditioning.  3.  Chronic wounds. The patient has wounds in different stages and obviously different stages of infection, all over her body. She has been using some Roland Rack  boots. We will continue to elevate her legs and hopefully work on ambulation.  4.  Acute kidney injury.  We will give the patient intravenous fluid.  She also has Valtrex on her medication list, but does not appear to have any active lesions.  Granted, I have been unable to complete [name] thorough exam of her perineum as her legs are so heavy and wounds so painful that it is difficult to visualize the entire area.  However, due to her kidney function and apparent lack of vesicles, we should probably consider decreasing the dose to the prophylactic dose of Valtrex.  5.  Diabetes type 2. We will hold metformin due to acute kidney injury. I have  also held her other oral hypoglycemics. We will give the patient sliding scale insulin while she is hospitalized.  6.  Morbid obesity. The patient's body mass index is 69.3. I have encouraged [name] significantly calorie reduced diet.  7.  Deep vein thrombosis prophylaxis. Compression stockings and heparin.  8.  Gastrointestinal prophylaxis is unnecessary.   CODE STATUS:  The patient is [name] FULL CODE.   TIME SPENT ON ADMISSION ORDERS AND PATIENT CARE: Approximately 45 minutes.      ____________________________ Kelton Pillar. Sheryle Hail, MD msd:DT D: 03/01/2014 08:05:28 ET T: 03/01/2014 08:31:33 ET JOB#: [id number]  cc: Kelton Pillar. Sheryle Hail, MD, <Dictator> Kelton Pillar Add Dinapoli MD ELECTRONICALLY SIGNED 03/14/2014 4:12

## 2014-06-27 NOTE — Discharge Summary (Signed)
PATIENT NAME:  Kathy [name], Kathy [name] MR#:  [id number] DATE OF BIRTH:  1940-07-17  DATE OF ADMISSION:  03/01/2014 DATE OF DISCHARGE:  03/08/2014   PRIMARY CARE PHYSICIAN:  Dorothey Basemanavid Bronstein, MD     PRESENTING COMPLAINT: Cellulitis of bilateral lower extremities.   DISCHARGE DIAGNOSES: 1. Bilateral lower extremity cellulitis with infected chronic ulcers.  2. Bilateral heel cast, stage I ulceration along with groin ulceration. The patient has Foley catheter in to help wound healing around the groin area.  3. Altered mental status improved.  4. Generalized deconditioning.  5. Acute renal failure, resolved.  6. Type 2 diabetes.  7. Hypertension.  8. Right upper extremity swelling. No evidence deep vein thrombosis.  9. Right upper extremity PICC line placement for IV antibiotics.  10. CODE STATUS: FULL CODE.   CONSULTATION:   1.  Wound management.  2.  Surgical consultation for multiple decubitus ulcers.   CONDITION ON DISCHARGE:   Fair, vitals are stable. The patient is [name] FULL CODE.   MEDICATIONS:  1.  Ertapenem 1 gram IV every 24 for 7 more days.  2.  Levothyroxine doses decreased to 0.05 mg p.o. daily. Repeat TSH in 4 weeks.  3.  Docusate 1 tablet b.i.d.  4.  Glyburide 5 mg b.i.d.  5.  Metformin 500 mg b.i.d.  6.  Bisacodyl 10 mg rectal p.r.n.  7.  Carbamazepine 300 mg every 6 hours.   8.  Dakin's solution, apply to topical area daily.  9 collagenase ointment, apply to affected areas instructed.  10. Sliding scale insulin.  11. Amlodipine 2.5 mg daily.  12. Nystatin as to affected area b.i.d.  13. Vitamin D2 at 50,000 units p.o. weekly.  14. Calcium carbonate with vitamin D 1 tablet b.i.d.  15. Metoprolol 25 mg b.i.d.  16. Aspirin 81 mg daily.  17. Neurontin 600 mg t.i.d.  18. Atorvastatin 80 mg at bedtime.  19. PICC line care per protocol.  20. Foley care per protocol.    INSTRUCTIONS: 1.  Once groin wounds are better, remove Foley.  2.  Wound care according to wound care nurse  instructions.   DIET:  Mechanical, soft, 2 grams sodium ADA 1800 calorie diet.   LABORATORY DATA:  White count is 7.0, hemoglobin and hematocrit is 9.4 and 28.6. Creatinine 0.90.   BRIEF SUMMARY OF HOSPITAL COURSE:  Ms. Kathy [name] is [name] 74 year old Caucasian female with past medical history of type 2 diabetes. She is legally blind, along with diabetic neuropathy, along with history of bilateral decubitus ulcers comes in with:  1. Cellulitis with infected chronic decubitus ulcers. The patient was started on IV vancomycin and Zosyn, which once cultures were negative, vancomycin was discontinued.  Zosyn is changed to IV ertapenem for 7 more days through PICC line at [facility]. She will need to get dressing changes on her ulcers according to the wound nurse instructions and follow-up at wound clinic after [facility].  2. Bilateral heel ulcers seen by surgery, recommends intensive wound management and instructions have been provided.  3. Altered mental status improved.  4. Generalized deconditioning, bedbound.  The patient's husband cannot safely help her at home. Hence, the patient is going to be discharged to rehabilitation at this time.  5. Acute renal failure, resolved with IV fluids.  6. Type 2 diabetes. Sliding scale insulin and p.o. meds were resumed once creatinine stabilizes.  7. Hypertension continue metoprolol and amlodipine.  8. Right upper extremity swelling. Negative for deep vein thrombosis. The patient has [name]  right PICC line, which will be discontinued once IV antibiotics are completed at [facility].  The above was discussed with the patient's husband who is agreeable to it. The patient will be discharged to [facility].  She remained [name] FULL CODE.   TIME SPENT: 40 minutes.     ____________________________ Wylie Hail Allena Katz, MD sap:DT D: 03/08/2014 13:05:00 ET T: 03/08/2014 13:41:27 ET JOB#: [id number]  cc: Latifah Padin [name]. Allena Katz, MD, <Dictator> Willow Ora MD ELECTRONICALLY SIGNED 03/11/2014 11:21

## 2014-11-12 ENCOUNTER — Other Ambulatory Visit: Payer: Self-pay | Admitting: Family Medicine

## 2014-11-12 DIAGNOSIS — R109 Unspecified abdominal pain: Secondary | ICD-10-CM

## 2014-11-15 ENCOUNTER — Ambulatory Visit
Admission: RE | Admit: 2014-11-15 | Discharge: 2014-11-15 | Disposition: A | Discharge status: Home / Self Care | Payer: Commercial Managed Care - HMO | Source: Ambulatory Visit | Attending: Family Medicine | Admitting: Family Medicine

## 2014-11-15 DIAGNOSIS — R109 Unspecified abdominal pain: Secondary | ICD-10-CM | POA: Insufficient documentation

## 2014-11-15 DIAGNOSIS — K760 Fatty (change of) liver, not elsewhere classified: Secondary | ICD-10-CM | POA: Diagnosis not present

## 2014-11-15 DIAGNOSIS — R918 Other nonspecific abnormal finding of lung field: Secondary | ICD-10-CM | POA: Diagnosis not present

## 2014-11-15 DIAGNOSIS — I2510 Atherosclerotic heart disease of native coronary artery without angina pectoris: Secondary | ICD-10-CM | POA: Insufficient documentation

## 2014-11-15 HISTORY — DX: Type 2 diabetes mellitus without complications: E11.9

## 2014-11-15 HISTORY — DX: Essential (primary) hypertension: I10

## 2014-11-15 MED ORDER — IOHEXOL 300 MG/ML  SOLN
100.0000 mL | Freq: Once | INTRAMUSCULAR | Status: AC | PRN
  Administered 2014-11-15: 100 mL via INTRAVENOUS

## 2015-02-25 ENCOUNTER — Encounter: Payer: Self-pay | Admitting: Emergency Medicine

## 2015-02-25 ENCOUNTER — Emergency Department: Payer: Medicare HMO

## 2015-02-25 ENCOUNTER — Inpatient Hospital Stay
Admission: EM | Admit: 2015-02-25 | Discharge: 2015-03-03 | DRG: 871 | Disposition: A | Discharge status: Skilled Nursing Facility | Payer: Medicare HMO | Attending: Internal Medicine | Admitting: Internal Medicine

## 2015-02-25 DIAGNOSIS — J189 Pneumonia, unspecified organism: Secondary | ICD-10-CM | POA: Diagnosis present

## 2015-02-25 DIAGNOSIS — Z8249 Family history of ischemic heart disease and other diseases of the circulatory system: Secondary | ICD-10-CM | POA: Diagnosis not present

## 2015-02-25 DIAGNOSIS — R4182 Altered mental status, unspecified: Secondary | ICD-10-CM | POA: Diagnosis present

## 2015-02-25 DIAGNOSIS — G8929 Other chronic pain: Secondary | ICD-10-CM | POA: Diagnosis present

## 2015-02-25 DIAGNOSIS — E785 Hyperlipidemia, unspecified: Secondary | ICD-10-CM | POA: Diagnosis present

## 2015-02-25 DIAGNOSIS — L89320 Pressure ulcer of left buttock, unstageable: Secondary | ICD-10-CM | POA: Diagnosis present

## 2015-02-25 DIAGNOSIS — R601 Generalized edema: Secondary | ICD-10-CM | POA: Diagnosis present

## 2015-02-25 DIAGNOSIS — L8990 Pressure ulcer of unspecified site, unspecified stage: Secondary | ICD-10-CM | POA: Insufficient documentation

## 2015-02-25 DIAGNOSIS — Z6841 Body Mass Index (BMI) 40.0 and over, adult: Secondary | ICD-10-CM | POA: Diagnosis not present

## 2015-02-25 DIAGNOSIS — A419 Sepsis, unspecified organism: Principal | ICD-10-CM | POA: Diagnosis present

## 2015-02-25 DIAGNOSIS — Z515 Encounter for palliative care: Secondary | ICD-10-CM | POA: Diagnosis present

## 2015-02-25 DIAGNOSIS — Z7982 Long term (current) use of aspirin: Secondary | ICD-10-CM | POA: Diagnosis not present

## 2015-02-25 DIAGNOSIS — I959 Hypotension, unspecified: Secondary | ICD-10-CM | POA: Diagnosis present

## 2015-02-25 DIAGNOSIS — N390 Urinary tract infection, site not specified: Secondary | ICD-10-CM | POA: Diagnosis present

## 2015-02-25 DIAGNOSIS — M5416 Radiculopathy, lumbar region: Secondary | ICD-10-CM | POA: Diagnosis present

## 2015-02-25 DIAGNOSIS — E039 Hypothyroidism, unspecified: Secondary | ICD-10-CM | POA: Diagnosis present

## 2015-02-25 DIAGNOSIS — L89150 Pressure ulcer of sacral region, unstageable: Secondary | ICD-10-CM | POA: Diagnosis present

## 2015-02-25 DIAGNOSIS — G9341 Metabolic encephalopathy: Secondary | ICD-10-CM | POA: Diagnosis present

## 2015-02-25 DIAGNOSIS — Z22322 Carrier or suspected carrier of Methicillin resistant Staphylococcus aureus: Secondary | ICD-10-CM

## 2015-02-25 DIAGNOSIS — Z66 Do not resuscitate: Secondary | ICD-10-CM | POA: Diagnosis present

## 2015-02-25 DIAGNOSIS — Z79899 Other long term (current) drug therapy: Secondary | ICD-10-CM | POA: Diagnosis not present

## 2015-02-25 DIAGNOSIS — B0229 Other postherpetic nervous system involvement: Secondary | ICD-10-CM | POA: Diagnosis present

## 2015-02-25 DIAGNOSIS — E119 Type 2 diabetes mellitus without complications: Secondary | ICD-10-CM | POA: Diagnosis present

## 2015-02-25 DIAGNOSIS — Z87891 Personal history of nicotine dependence: Secondary | ICD-10-CM

## 2015-02-25 DIAGNOSIS — E860 Dehydration: Secondary | ICD-10-CM | POA: Diagnosis present

## 2015-02-25 DIAGNOSIS — D649 Anemia, unspecified: Secondary | ICD-10-CM | POA: Diagnosis present

## 2015-02-25 DIAGNOSIS — E43 Unspecified severe protein-calorie malnutrition: Secondary | ICD-10-CM | POA: Diagnosis present

## 2015-02-25 DIAGNOSIS — R32 Unspecified urinary incontinence: Secondary | ICD-10-CM | POA: Diagnosis present

## 2015-02-25 DIAGNOSIS — I10 Essential (primary) hypertension: Secondary | ICD-10-CM | POA: Diagnosis present

## 2015-02-25 DIAGNOSIS — I4581 Long QT syndrome: Secondary | ICD-10-CM | POA: Diagnosis present

## 2015-02-25 DIAGNOSIS — E46 Unspecified protein-calorie malnutrition: Secondary | ICD-10-CM | POA: Diagnosis not present

## 2015-02-25 DIAGNOSIS — A4102 Sepsis due to Methicillin resistant Staphylococcus aureus: Secondary | ICD-10-CM | POA: Diagnosis not present

## 2015-02-25 DIAGNOSIS — H409 Unspecified glaucoma: Secondary | ICD-10-CM | POA: Diagnosis present

## 2015-02-25 DIAGNOSIS — L308 Other specified dermatitis: Secondary | ICD-10-CM | POA: Diagnosis present

## 2015-02-25 LAB — URINALYSIS COMPLETE WITH MICROSCOPIC (ARMC ONLY)
GLUCOSE, UA: NEGATIVE mg/dL
HGB URINE DIPSTICK: NEGATIVE
Nitrite: NEGATIVE
Protein, ur: 30 mg/dL — AB
SPECIFIC GRAVITY, URINE: 1.024 (ref 1.005–1.030)
pH: 5 (ref 5.0–8.0)

## 2015-02-25 LAB — CBC WITH DIFFERENTIAL/PLATELET
BASOS ABS: 0 10*3/uL (ref 0–0.1)
Basophils Relative: 0 %
EOS PCT: 0 %
Eosinophils Absolute: 0 10*3/uL (ref 0–0.7)
HCT: 39.7 % (ref 35.0–47.0)
HEMOGLOBIN: 13.3 g/dL (ref 12.0–16.0)
LYMPHS PCT: 15 %
Lymphs Abs: 2.2 10*3/uL (ref 1.0–3.6)
MCH: 33.8 pg (ref 26.0–34.0)
MCHC: 33.4 g/dL (ref 32.0–36.0)
MCV: 101.2 fL — AB (ref 80.0–100.0)
Monocytes Absolute: 0.7 10*3/uL (ref 0.2–0.9)
Monocytes Relative: 5 %
NEUTROS ABS: 11.6 10*3/uL — AB (ref 1.4–6.5)
NEUTROS PCT: 80 %
PLATELETS: 445 10*3/uL — AB (ref 150–440)
RBC: 3.92 MIL/uL (ref 3.80–5.20)
RDW: 15.5 % — ABNORMAL HIGH (ref 11.5–14.5)
WBC: 14.5 10*3/uL — AB (ref 3.6–11.0)

## 2015-02-25 LAB — BASIC METABOLIC PANEL
ANION GAP: 16 — AB (ref 5–15)
BUN: 52 mg/dL — ABNORMAL HIGH (ref 6–20)
CHLORIDE: 100 mmol/L — AB (ref 101–111)
CO2: 15 mmol/L — AB (ref 22–32)
Calcium: 7.8 mg/dL — ABNORMAL LOW (ref 8.9–10.3)
Creatinine, Ser: 1.01 mg/dL — ABNORMAL HIGH (ref 0.44–1.00)
GFR calc non Af Amer: 54 mL/min — ABNORMAL LOW (ref >60)
Glucose, Bld: 176 mg/dL — ABNORMAL HIGH (ref 65–99)
Potassium: 4.6 mmol/L (ref 3.5–5.1)
Sodium: 131 mmol/L — ABNORMAL LOW (ref 135–145)

## 2015-02-25 LAB — TROPONIN I

## 2015-02-25 LAB — LACTIC ACID, PLASMA
Lactic Acid, Venous: 5.7 mmol/L (ref 0.5–2.0)
Lactic Acid, Venous: 5.1 mmol/L (ref 0.5–2.0)

## 2015-02-25 MED ORDER — PIPERACILLIN-TAZOBACTAM 3.375 G IVPB
3.3750 g | Freq: Once | INTRAVENOUS | Status: AC
  Administered 2015-02-25: 3.375 g via INTRAVENOUS
  Filled 2015-02-25: qty 50

## 2015-02-25 MED ORDER — ONDANSETRON HCL 4 MG/2ML IJ SOLN
4.0000 mg | Freq: Four times a day (QID) | INTRAMUSCULAR | Status: DC | PRN
  Administered 2015-02-27 – 2015-03-01 (×5): 4 mg via INTRAVENOUS
  Filled 2015-02-25 (×6): qty 2

## 2015-02-25 MED ORDER — PIPERACILLIN-TAZOBACTAM 3.375 G IVPB
3.3750 g | Freq: Three times a day (TID) | INTRAVENOUS | Status: DC
  Administered 2015-02-26 – 2015-03-03 (×16): 3.375 g via INTRAVENOUS
  Filled 2015-02-25 (×22): qty 50

## 2015-02-25 MED ORDER — SODIUM CHLORIDE 0.9 % IJ SOLN
3.0000 mL | Freq: Two times a day (BID) | INTRAMUSCULAR | Status: DC
  Administered 2015-02-25 – 2015-03-03 (×10): 3 mL via INTRAVENOUS

## 2015-02-25 MED ORDER — SODIUM CHLORIDE 0.9 % IV BOLUS (SEPSIS)
1000.0000 mL | Freq: Once | INTRAVENOUS | Status: AC
  Administered 2015-02-25: 1000 mL via INTRAVENOUS

## 2015-02-25 MED ORDER — IOHEXOL 300 MG/ML  SOLN
100.0000 mL | Freq: Once | INTRAMUSCULAR | Status: AC | PRN
  Administered 2015-02-25: 100 mL via INTRAVENOUS

## 2015-02-25 MED ORDER — PROMETHAZINE HCL 25 MG/ML IJ SOLN
12.5000 mg | Freq: Once | INTRAMUSCULAR | Status: DC
  Filled 2015-02-25: qty 1

## 2015-02-25 MED ORDER — ACETAMINOPHEN 650 MG RE SUPP: 650.0000 mg | Freq: Four times a day (QID) | RECTAL | Status: DC | PRN

## 2015-02-25 MED ORDER — VANCOMYCIN HCL IN DEXTROSE 1-5 GM/200ML-% IV SOLN
1000.0000 mg | Freq: Once | INTRAVENOUS | Status: AC
  Administered 2015-02-25: 1000 mg via INTRAVENOUS
  Filled 2015-02-25: qty 200

## 2015-02-25 MED ORDER — OXYCODONE HCL 5 MG PO TABS
5.0000 mg | ORAL_TABLET | ORAL | Status: DC | PRN
  Administered 2015-02-26 – 2015-03-03 (×5): 5 mg via ORAL
  Filled 2015-02-25 (×6): qty 1

## 2015-02-25 MED ORDER — VANCOMYCIN HCL IN DEXTROSE 1-5 GM/200ML-% IV SOLN
1000.0000 mg | INTRAVENOUS | Status: DC
  Administered 2015-02-26 – 2015-02-27 (×3): 1000 mg via INTRAVENOUS
  Filled 2015-02-25 (×4): qty 200

## 2015-02-25 MED ORDER — IOHEXOL 240 MG/ML SOLN: 25.0000 mL | Freq: Once | INTRAMUSCULAR | Status: DC | PRN

## 2015-02-25 MED ORDER — ONDANSETRON HCL 4 MG PO TABS
4.0000 mg | ORAL_TABLET | Freq: Four times a day (QID) | ORAL | Status: DC | PRN
  Administered 2015-02-27 – 2015-03-03 (×3): 4 mg via ORAL
  Filled 2015-02-25 (×3): qty 1

## 2015-02-25 MED ORDER — HEPARIN SODIUM (PORCINE) 5000 UNIT/ML IJ SOLN
5000.0000 [IU] | Freq: Three times a day (TID) | INTRAMUSCULAR | Status: DC
  Administered 2015-02-26 – 2015-03-01 (×10): 5000 [IU] via SUBCUTANEOUS
  Filled 2015-02-25 (×11): qty 1

## 2015-02-25 MED ORDER — MORPHINE SULFATE (PF) 2 MG/ML IV SOLN
2.0000 mg | INTRAVENOUS | Status: DC | PRN
  Administered 2015-02-28 – 2015-03-02 (×5): 2 mg via INTRAVENOUS
  Filled 2015-02-25 (×6): qty 1

## 2015-02-25 MED ORDER — ACETAMINOPHEN 325 MG PO TABS: 650.0000 mg | ORAL_TABLET | Freq: Four times a day (QID) | ORAL | Status: DC | PRN

## 2015-02-25 MED ORDER — SODIUM CHLORIDE 0.9 % IV SOLN
INTRAVENOUS | Status: DC
  Administered 2015-02-25 – 2015-02-28 (×8): via INTRAVENOUS

## 2015-02-25 NOTE — H&P (Signed)
[facility] - [place] at [facility]   PATIENT NAME: Kathy [name]    MR#:  [id number]  DATE OF BIRTH:  1940/05/23   DATE OF ADMISSION:  02/25/2015  PRIMARY CARE PHYSICIAN: Dimple Casey, MD   REQUESTING/REFERRING PHYSICIAN: Derrill Kay  CHIEF COMPLAINT:   Edema  HISTORY OF PRESENT ILLNESS:  Kathy [name]  is a 74 y.o. female with a known history of type 2 diabetes non-insulin-requiring as well as essential hypertension who is presenting with edema. The patient is unable to find meaningful information given mental status/medical condition history obtained from son who is present at bedside. States that the patient has been experiencing nauseousness with vomiting intermittently for approximately 2 months total duration for about 9 days in total now she's been being treated for urinary tract infection by receiving IM Rocephin as well as a general concern for dehydration so the nursing facility gave her approximately 3-4 L of subcutaneous normal saline. She is now developed diffuse anasarca thus prompting the concern presented to Hospital further workup and evaluation. Per the son he was given the options of "you can take her to hospice or the hospital"  PAST MEDICAL HISTORY:   Past Medical History  Diagnosis Date  . Diabetes mellitus without complication (HCC)   . Hypertension     PAST SURGICAL HISTORY:  History reviewed. No pertinent past surgical history.  SOCIAL HISTORY:   Social History  Substance Use Topics  . Smoking status: Former Games developer  . Smokeless tobacco: Not on file  . Alcohol Use: No    FAMILY HISTORY:   Family History  Problem Relation Age of Onset  . Hypertension Other     DRUG ALLERGIES:  No Known Allergies  REVIEW OF SYSTEMS:  Unobtainable at this time given patient's mental status/medical condition  MEDICATIONS AT HOME:   Prior to Admission medications   Medication Sig Start Date End Date Taking? Authorizing Provider  Amino  Acids-Protein Hydrolys (FEEDING SUPPLEMENT, PRO-STAT SUGAR FREE 64,) LIQD Take 60 mLs by mouth 3 (three) times daily.   Yes Historical Provider, MD  amLODipine (NORVASC) 2.5 MG tablet Take 2.5 mg by mouth daily.   Yes Historical Provider, MD  aspirin EC 81 MG tablet Take 81 mg by mouth daily.   Yes Historical Provider, MD  azelastine (ASTELIN) 0.1 % nasal spray Place 2 sprays into both nostrils 2 (two) times daily. Use in each nostril as directed   Yes Historical Provider, MD  bisacodyl (DULCOLAX) 10 MG suppository Place 10 mg rectally daily as needed for moderate constipation.   Yes Historical Provider, MD  calcium-vitamin D (OSCAL WITH D) 500-200 MG-UNIT tablet Take 1 tablet by mouth 2 (two) times daily.   Yes Historical Provider, MD  Capsaicin (CAPSAGEL) 0.025 % GEL Apply 1 application topically 3 (three) times daily. APPLY TOPICALLY TO RIGHT FLANK FOR PAIN   Yes Historical Provider, MD  carbamazepine (TEGRETOL) 200 MG tablet Take 300 mg by mouth every 6 (six) hours.   Yes Historical Provider, MD  cefTRIAXone (ROCEPHIN) 1 g injection Inject 1 g into the muscle daily. FOR 10 DAYS 02/16/15 02/26/15 Yes Historical Provider, MD  fluconazole (DIFLUCAN) 200 MG tablet Take 200 mg by mouth at bedtime. 02/24/15 03/03/15 Yes Historical Provider, MD  fluticasone (FLONASE) 50 MCG/ACT nasal spray Place 2 sprays into both nostrils daily.   Yes Historical Provider, MD  gabapentin (NEURONTIN) 300 MG capsule Take 300 mg by mouth at bedtime.   Yes Historical Provider, MD  HYDROmorphone (DILAUDID)  4 MG tablet Take 4 mg by mouth every 12 (twelve) hours as needed for severe pain.   Yes Historical Provider, MD  levothyroxine (SYNTHROID, LEVOTHROID) 50 MCG tablet Take 50 mcg by mouth daily before breakfast.   Yes Historical Provider, MD  metFORMIN (GLUCOPHAGE) 850 MG tablet Take 850 mg by mouth 2 (two) times daily with a meal.   Yes Historical Provider, MD  metoprolol tartrate (LOPRESSOR) 25 MG tablet Take 25 mg by mouth 2  (two) times daily.   Yes Historical Provider, MD  nystatin ointment (MYCOSTATIN) Apply 1 application topically every 12 (twelve) hours. APPLY TO GROIN TOPICALLY EVERY 12 HOURS FOR FUNGAL INFECTION 02/03/15  Yes Historical Provider, MD  promethazine (PHENERGAN) 25 MG tablet Take 25 mg by mouth every 6 (six) hours as needed for nausea or vomiting.   Yes Historical Provider, MD  senna (SENOKOT) 8.6 MG TABS tablet Take 1 tablet by mouth 2 (two) times daily.   Yes Historical Provider, MD  Vitamin D, Ergocalciferol, (DRISDOL) 50000 units CAPS capsule Take 50,000 Units by mouth every 7 (seven) days. GIVE ON MONDAYS   Yes Historical Provider, MD      VITAL SIGNS:  Blood pressure 108/64, pulse 95, temperature 97.3 F (36.3 C), temperature source Oral, resp. rate 11, height 5' (1.524 m), weight 220 lb 0.3 oz (99.8 kg), SpO2 100 %.  PHYSICAL EXAMINATION:  VITAL SIGNS: Filed Vitals:   02/25/15 2230 02/25/15 2330  BP: 102/52 108/64  Pulse:  95  Temp:    Resp: 12 11   GENERAL:74 y.o.female currently in moderate acute distress given mental status.  HEAD: Normocephalic, atraumatic.  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 Extraocular muscles intact. No scleral icterus.  MOUTH: Dry mucosal membrane. Dentition intact. No abscess noted.  EAR, NOSE, THROAT: Clear without exudates. No external lesions.  NECK: Supple. No thyromegaly. No nodules. No JVD.  PULMONARY: Clear to ascultation, without wheeze rails or rhonci. No use of accessory muscles, Good respiratory effort. good air entry bilaterally CHEST: Nontender to palpation.  CARDIOVASCULAR: S1 and S2. Regular rate and rhythm. No murmurs, rubs, or gallops. Anasarca. Pedal pulses 2+ bilaterally.  GASTROINTESTINAL: Soft, nontender, nondistended. No masses. Positive bowel sounds. No hepatosplenomegaly.  MUSCULOSKELETAL: No swelling, clubbing, or edema. Range of motion  full in all extremities.  NEUROLOGIC: Unable to fully assess given patient's mental status/medical condition SKIN: Erythema in groin folds, sacral decubitus ulcer present, No further ulceration, lesions, rashes, or cyanosis. Skin warm and dry. Turgor intact.  PSYCHIATRIC: Unable to fully assess given patient's mental status/medical condition   LABORATORY PANEL:   CBC  Recent Labs Lab 02/25/15 1820  WBC 14.5*  HGB 13.3  HCT 39.7  PLT 445*   ------------------------------------------------------------------------------------------------------------------  Chemistries   Recent Labs Lab 02/25/15 1820  NA 131*  K 4.6  CL 100*  CO2 15*  GLUCOSE 176*  BUN 52*  CREATININE 1.01*  CALCIUM 7.8*   ------------------------------------------------------------------------------------------------------------------  Cardiac Enzymes  Recent Labs Lab 02/25/15 1820  TROPONINI <0.03   ------------------------------------------------------------------------------------------------------------------  RADIOLOGY:  Ct Abdomen Pelvis W Contrast  02/25/2015  CLINICAL DATA:  74 year old female with abdominal pain, nausea and vomiting. EXAM: CT ABDOMEN AND PELVIS WITH CONTRAST TECHNIQUE: Multidetector CT imaging of the abdomen and pelvis was performed using the standard protocol following bolus administration of intravenous contrast. CONTRAST:  100mL OMNIPAQUE IOHEXOL 300 MG/ML  SOLN COMPARISON:  CT dated 11/15/14 FINDINGS: There is a patchy area of  consolidation with air bronchogram at the right lung base which may represent atelectasis/ scarring versus pneumonia. Clinical correlation is recommended. Small scattered bilateral pulmonary nodules again noted similar to prior study from. There is coronary vascular calcification. No intra-abdominal free air.  Small ascites, new from prior study. The gallbladder is not visualized, likely surgically absent. The liver appears unremarkable. There is  moderate to severe fatty infiltration of the pancreas. The spleen and adrenal glands appear unremarkable. There is no hydronephrosis on either side. The visualized ureters and urinary bladder appear unremarkable. The uterus is grossly unremarkable. Constipation. No evidence of bowel obstruction or inflammation. The appendix is not visualized with certainty, likely surgically absent. There is aortoiliac atherosclerotic disease. No portal venous gas identified. There is no adenopathy. There is diffuse subcutaneous soft tissue stranding and anasarca. Small pockets of air in the superficial subcutaneous soft tissues of the left anterior pelvic wall may be related to recent injection: Trauma. Clinical correlation is recommended. No drainable fluid collection/ abscess identified. The bones are osteopenic. There are degenerative changes of the spine. No acute fracture. IMPRESSION: Small ascites diffuse mesenteric and subcutaneous soft tissue edema and anasarca, new from prior study. Clinical correlation is recommended. Constipation.  No evidence of bowel obstruction or inflammation. Right lung base atelectasis/scarring versus pneumonia. Clinical correlation recommended. Innumerable bilateral small pulmonary nodules similar to prior study. Electronically Signed   By: Elgie Collard M.D.   On: 02/25/2015 22:46   Dg Chest Portable 1 View  02/25/2015  CLINICAL DATA:  Right IJ line placement.  Initial encounter. EXAM: PORTABLE CHEST 1 VIEW COMPARISON:  Chest radiograph performed earlier today at 7:35 p.m. FINDINGS: The right IJ line is noted ending overlying the distal SVC. The lungs are hypoexpanded. Bibasilar airspace opacities likely reflect atelectasis. No pleural effusion or pneumothorax is seen. The cardiomediastinal silhouette remains normal in size. No acute osseous abnormalities are identified. IMPRESSION: 1. Right IJ line noted ending overlying the distal SVC. 2. Lungs hypoexpanded. Bibasilar airspace opacities  likely reflect atelectasis. Electronically Signed   By: Roanna Raider M.D.   On: 02/25/2015 21:37   Dg Chest Port 1 View  02/25/2015  CLINICAL DATA:  74 year old female with altered mental status EXAM: PORTABLE CHEST 1 VIEW COMPARISON:  Radiograph dated 03/04/2014 FINDINGS: There are bilateral mid lung field linear atelectasis/scarring. A focal area of airspace opacity in the right lung base may also represent atelectatic changes or pneumonia. Clinical correlation is recommended. There is no significant pleural effusion or pneumothorax. The cardiac silhouette is within normal limits. The osseous structures appear grossly unremarkable. IMPRESSION: Subsegmental right lung base atelectasis versus pneumonia. Clinical correlation and follow-up recommended. Electronically Signed   By: Elgie Collard M.D.   On: 02/25/2015 19:57    EKG:   Orders placed or performed during the hospital encounter of 02/25/15  . EKG 12-Lead  . EKG 12-Lead    IMPRESSION AND PLAN:   74 year old Caucasian female history of type 2 diabetes non-insulin-requiring as well as essential hypertension who is presenting with anasarca  1. Sepsis, meeting septic criteria by heart rate, leukocytosis present on arrival. Source likely urinary tract infection  Broad-spectrum antibiotics including vancomycin/Zosyn and taper antibiotics when culture data returns. He has received a 30 mL/kg IV fluid bolus. Continue IV fluid hydration to keep mean arterial pressure greater than 65. He may require pressor therapy if blood pressure worsens. We will repeat lactic acid given the initial is greater than 2.2.  2. Type 2 diabetes non-insulin-requiring: Hold oral agents at  insulin sliding scale place on Accu-Cheks 3. Essential hypertension: Hold hypertensive agents given her relative hypotension 4. Hypothyroidism unspecified: Continue Synthroid 5. Venous thromboembolism prophylactic: Heparin subcutaneous      All the records are reviewed and  case discussed with ED provider. Management plans discussed with the patient, family and they are in agreement.  CODE STATUS: Full  TOTAL TIME TAKING CARE OF THIS PATIENT: 45 minutes.    Dennie Vecchio,  Mardi Mainland.D on 02/25/2015 at 11:38 PM  Between 7am to 6pm - Pager - [phone]  After 6pm: House Pager: - [phone]  [facility]  Office  [phone]  CC: Primary care physician; Dimple Casey, MD

## 2015-02-25 NOTE — ED Provider Notes (Signed)
[facility] Emergency Department Provider Note    ____________________________________________  Time seen: On EMS arrival  I have reviewed the triage vital signs and the nursing notes.   HISTORY  Chief Complaint Edema  History limited by: AMS   HPI Kathy [name] is a 74 y.o. female who presents to the emergency department from [facility] healthcare today via EMS because of primary concern for edema. The patient was being treated over at [facility] house for a UTI. Per EMS the patient was on day 9 of 10 of IM Rocephin. The patient was also receiving subcutaneous fluids given concern for dehydration and inability to get IV. Per EMS report they stopped the fluids for 5 days ago. Patient has continued to have edema. They did try giving a dose of 40 mg of Lasix without any great improvement. The patient herself is unable to give any history.    Past Medical History  Diagnosis Date  . Diabetes mellitus without complication (HCC)   . Hypertension     Patient Active Problem List   Diagnosis Date Noted  . Sepsis (HCC) 02/25/2015    History reviewed. No pertinent past surgical history.  Current Outpatient Rx  Name  Route  Sig  Dispense  Refill  . Amino Acids-Protein Hydrolys (FEEDING SUPPLEMENT, PRO-STAT SUGAR FREE 64,) LIQD   Oral   Take 60 mLs by mouth 3 (three) times daily.         Marland Kitchen amLODipine (NORVASC) 2.5 MG tablet   Oral   Take 2.5 mg by mouth daily.         Marland Kitchen aspirin EC 81 MG tablet   Oral   Take 81 mg by mouth daily.         Marland Kitchen azelastine (ASTELIN) 0.1 % nasal spray   Each Nare   Place 2 sprays into both nostrils 2 (two) times daily. Use in each nostril as directed         . bisacodyl (DULCOLAX) 10 MG suppository   Rectal   Place 10 mg rectally daily as needed for moderate constipation.         . calcium-vitamin D (OSCAL WITH D) 500-200 MG-UNIT tablet   Oral   Take 1 tablet by mouth 2 (two) times daily.         . Capsaicin  (CAPSAGEL) 0.025 % GEL   Apply externally   Apply 1 application topically 3 (three) times daily. APPLY TOPICALLY TO RIGHT FLANK FOR PAIN         . carbamazepine (TEGRETOL) 200 MG tablet   Oral   Take 300 mg by mouth every 6 (six) hours.         . cefTRIAXone (ROCEPHIN) 1 g injection   Intramuscular   Inject 1 g into the muscle daily. FOR 10 DAYS         . fluconazole (DIFLUCAN) 200 MG tablet   Oral   Take 200 mg by mouth at bedtime.         . fluticasone (FLONASE) 50 MCG/ACT nasal spray   Each Nare   Place 2 sprays into both nostrils daily.         Marland Kitchen gabapentin (NEURONTIN) 300 MG capsule   Oral   Take 300 mg by mouth at bedtime.         Marland Kitchen HYDROmorphone (DILAUDID) 4 MG tablet   Oral   Take 4 mg by mouth every 12 (twelve) hours as needed for severe pain.         Marland Kitchen  levothyroxine (SYNTHROID, LEVOTHROID) 50 MCG tablet   Oral   Take 50 mcg by mouth daily before breakfast.         . metFORMIN (GLUCOPHAGE) 850 MG tablet   Oral   Take 850 mg by mouth 2 (two) times daily with a meal.         . metoprolol tartrate (LOPRESSOR) 25 MG tablet   Oral   Take 25 mg by mouth 2 (two) times daily.         Marland Kitchen nystatin ointment (MYCOSTATIN)   Topical   Apply 1 application topically every 12 (twelve) hours. APPLY TO GROIN TOPICALLY EVERY 12 HOURS FOR FUNGAL INFECTION         . promethazine (PHENERGAN) 25 MG tablet   Oral   Take 25 mg by mouth every 6 (six) hours as needed for nausea or vomiting.         . senna (SENOKOT) 8.6 MG TABS tablet   Oral   Take 1 tablet by mouth 2 (two) times daily.         . Vitamin D, Ergocalciferol, (DRISDOL) 50000 units CAPS capsule   Oral   Take 50,000 Units by mouth every 7 (seven) days. GIVE ON MONDAYS           Allergies Review of patient's allergies indicates no known allergies.  Family History  Problem Relation Age of Onset  . Hypertension Other     Social History Social History  Substance Use Topics  .  Smoking status: Former Games developer  . Smokeless tobacco: None  . Alcohol Use: No    Review of Systems Unable to obtain secondary to altered mental status. ____________________________________________   PHYSICAL EXAM:  VITAL SIGNS:   97.3 F (36.3 C)  88  13   87/47 mmHg  100 %     Constitutional: Awake, alert, not oriented. Moaning. ENT   Head: Normocephalic and atraumatic.   Nose: No congestion/rhinnorhea.   Mouth/Throat: Mucous membranes are moist.   Neck: No stridor. Hematological/Lymphatic/Immunilogical: No cervical lymphadenopathy. Cardiovascular: Normal rate, regular rhythm.  No murmurs, rubs, or gallops. Respiratory: Normal respiratory effort without tachypnea nor retractions. Question rhonchi at bases, exam somewhat limited to body habitus.  Gastrointestinal: Soft and nontender. No distention. Genitourinary: Deferred Musculoskeletal: Normal range of motion in all extremities. No joint effusions. 4+ pitting edema in bilateral lower extremities and right upper extremity.  Neurologic:  Patient moaning, will attempt to follow commands. Not completely oriented. Moves all extremities. Sensation appears to be intact.  Skin:  Skin is warm, dry and intact. No rash noted.  ____________________________________________    LABS (pertinent positives/negatives)  Labs Reviewed  CBC WITH DIFFERENTIAL/PLATELET - Abnormal; Notable for the following:    WBC 14.5 (*)    MCV 101.2 (*)    RDW 15.5 (*)    Platelets 445 (*)    Neutro Abs 11.6 (*)    All other components within normal limits  URINALYSIS COMPLETEWITH MICROSCOPIC ([facility] ONLY) - Abnormal; Notable for the following:    Color, Urine AMBER (*)    APPearance HAZY (*)    Bilirubin Urine 1+ (*)    Ketones, ur TRACE (*)    Protein, ur 30 (*)    Leukocytes, UA TRACE (*)    Bacteria, UA RARE (*)    Squamous Epithelial / LPF 6-30 (*)    All other components within normal limits  LACTIC ACID, PLASMA - Abnormal;  Notable for the following:    Lactic Acid, Venous 5.7 (*)  All other components within normal limits  LACTIC ACID, PLASMA - Abnormal; Notable for the following:    Lactic Acid, Venous 5.1 (*)    All other components within normal limits  BASIC METABOLIC PANEL - Abnormal; Notable for the following:    Sodium 131 (*)    Chloride 100 (*)    CO2 15 (*)    Glucose, Bld 176 (*)    BUN 52 (*)    Creatinine, Ser 1.01 (*)    Calcium 7.8 (*)    GFR calc non Af Amer 54 (*)    Anion gap 16 (*)    All other components within normal limits  CULTURE, BLOOD (ROUTINE X 2)  CULTURE, BLOOD (ROUTINE X 2)  TROPONIN I  CBC  CREATININE, SERUM  BASIC METABOLIC PANEL  CBC     ____________________________________________   EKG  I, Phineas SemenGraydon Tawny Raspberry, attending physician, personally viewed and interpreted this EKG  EKG Time: 1804 Rate: 88 Rhythm: normal sinus rhythm Axis: normal Intervals: qtc 503 QRS: narrow ST changes: no st elevation Impression: prolonged qt, abnormal ekg  ____________________________________________    RADIOLOGY  CT abd/pel  IMPRESSION: Small ascites diffuse mesenteric and subcutaneous soft tissue edema and anasarca, new from prior study. Clinical correlation is recommended.  Constipation. No evidence of bowel obstruction or inflammation.  Right lung base atelectasis/scarring versus pneumonia. Clinical correlation recommended. Innumerable bilateral small pulmonary nodules similar to prior study.  CXR  IMPRESSION: 1. Right IJ line noted ending overlying the distal SVC. 2. Lungs hypoexpanded. Bibasilar airspace opacities likely reflect Atelectasis.   ____________________________________________   PROCEDURES  Procedure(s) performed: [employer] Energy[employer] Line , see procedure note(s).  Critical Care performed: Yes, see critical care note(s)  CENTRAL LINE Performed by: Phineas SemenGOODMAN, Macy Lingenfelter Consent: The procedure was performed in an emergent situation. Required  items: required blood products, implants, devices, and special equipment available Patient identity confirmed: arm band and provided demographic data Time out: Immediately prior to procedure a "time out" was called to verify the correct patient, procedure, equipment, support staff and site/side marked as required. Indications: vascular access Anesthesia: local infiltration Local anesthetic: lidocaine 1% with epinephrine Anesthetic total: 3 ml Patient sedated: no Preparation: skin prepped with 2% chlorhexidine Skin prep agent dried: skin prep agent completely dried prior to procedure Sterile barriers: all five maximum sterile barriers used - cap, mask, sterile gown, sterile gloves, and large sterile sheet Hand hygiene: hand hygiene performed prior to central venous catheter insertion  Location details: Right IJ  Catheter type: triple lumen Catheter size: 8 Fr Pre-procedure: landmarks identified Ultrasound guidance: yes Successful placement: yes Post-procedure: line sutured and dressing applied Assessment: blood return through all parts, free fluid flow, placement verified by x-ray and no pneumothorax on x-ray Patient tolerance: Patient tolerated the procedure well with no immediate complications.  CRITICAL CARE Performed by: Phineas SemenGOODMAN, Salvadore Valvano   Total critical care time: 40 minutes  Critical care time was exclusive of separately billable procedures and treating other patients.  Critical care was necessary to treat or prevent imminent or life-threatening deterioration.  Critical care was time spent personally by me on the following activities: development of treatment plan with patient and/or surrogate as well as nursing, discussions with consultants, evaluation of patient's response to treatment, examination of patient, obtaining history from patient or surrogate, ordering and performing treatments and interventions, ordering and review of laboratory studies, ordering and review of  radiographic studies, pulse oximetry and re-evaluation of patient's condition.  ____________________________________________   INITIAL IMPRESSION / ASSESSMENT AND PLAN /  ED COURSE  Pertinent labs & imaging results that were available during my care of the patient were reviewed by me and considered in my medical decision making (see chart for details).  Patient presented to the emergency department because of concerns for edema and altered mental status. Patient was not able to give me any history. Patient's blood pressure was noted to be low. Peripheral IVs were of questionable efficacy. Given continued hypotension I did place a central line. Patient tolerated the procedure well and post placement chest x-ray appeared to be good. The patient was given multiple fluid pulses and multiple broad-spectrum antibiotics for presumed infection. Lactic acid was elevated. Patient will be admitted to the hospitalist service for further workup and evaluation.  ____________________________________________   FINAL CLINICAL IMPRESSION(S) / ED DIAGNOSES  Final diagnoses:  Altered mental status  Sepsis, due to unspecified organism [facility])     Phineas Semen, MD 02/25/15 [phone]

## 2015-02-25 NOTE — Progress Notes (Signed)
ANTIBIOTIC CONSULT NOTE - INITIAL  Pharmacy Consult for vancomycin and Zosyn dosing Indication: sepsis  No Known Allergies  Patient Measurements: Height: 5' (152.4 cm) Weight: 220 lb 0.3 oz (99.8 kg) IBW/kg (Calculated) : 45.5 Adjusted Body Weight: 67kg  Vital Signs: Temp: 97.3 F (36.3 C) (12/30 1810) Temp Source: Oral (12/30 1810) BP: 102/52 mmHg (12/30 2230) Pulse Rate: 92 (12/30 2113) Intake/Output from previous day:   Intake/Output from this shift:    Labs:  Recent Labs  02/25/15 1820  WBC 14.5*  HGB 13.3  PLT 445*  CREATININE 1.01*   Estimated Creatinine Clearance: 53.4 mL/min (by C-G formula based on Cr of 1.01). No results for input(s): VANCOTROUGH, VANCOPEAK, VANCORANDOM, GENTTROUGH, GENTPEAK, GENTRANDOM, TOBRATROUGH, TOBRAPEAK, TOBRARND, AMIKACINPEAK, AMIKACINTROU, AMIKACIN in the last 72 hours.   Microbiology: No results found for this or any previous visit (from the past 720 hour(s)).  Medical History: Past Medical History  Diagnosis Date  . Diabetes mellitus without complication (HCC)   . Hypertension     Medications:   Assessment: Blood cx pending UA: LE(tr) NO2(-) WBC 6-30 CXR: bibasilar opacities, atelectasis  Goal of Therapy:  Vancomycin trough level 15-20 mcg/ml  Plan:  TBW 99.8kg  IBW 45.5kg  DW 67kg  Vd 47L kei 0.048 hr-1  t1/2 14 hours Vancomycin 1 gram q 18 hours ordered with stacked dosing. Level before 5th dose  Zosyn 3.375 grams q 8 hours ordered.  Kathy [name] S 02/25/2015,11:32 PM

## 2015-02-25 NOTE — ED Notes (Signed)
Pt in via EMS from [facility]; pt brought in due to generalized edema x 1 week.  Pt presents w/ nausea/vomiting x 1 day.  Pt recently being treated for UTI at [facility].  EMS reports that per [facility] pt received 4 bags fluid subcutaneously 10 days ago for dehydration because they were unable to start a line on her.  Pt presents w/ generalized edema to bilateral upper and lower extremities.  Pt moaning/groaning but denies pain anywhere.  Pt A/O to self only.  Pt hypotensive 87/47, other vitals WDL.  Pt husband at bedside.

## 2015-02-25 NOTE — ED Notes (Signed)
Patient transported to CT 

## 2015-02-26 ENCOUNTER — Encounter: Payer: Self-pay | Admitting: Emergency Medicine

## 2015-02-26 DIAGNOSIS — L8990 Pressure ulcer of unspecified site, unspecified stage: Secondary | ICD-10-CM | POA: Insufficient documentation

## 2015-02-26 LAB — GLUCOSE, CAPILLARY
GLUCOSE-CAPILLARY: 163 mg/dL — AB (ref 65–99)
GLUCOSE-CAPILLARY: 165 mg/dL — AB (ref 65–99)
GLUCOSE-CAPILLARY: 116 mg/dL — AB (ref 65–99)
Glucose-Capillary: 120 mg/dL — ABNORMAL HIGH (ref 65–99)

## 2015-02-26 LAB — BASIC METABOLIC PANEL
Anion gap: 13 (ref 5–15)
BUN: 50 mg/dL — ABNORMAL HIGH (ref 6–20)
CALCIUM: 7.4 mg/dL — AB (ref 8.9–10.3)
CO2: 17 mmol/L — ABNORMAL LOW (ref 22–32)
CREATININE: 0.95 mg/dL (ref 0.44–1.00)
Chloride: 102 mmol/L (ref 101–111)
GFR, EST NON AFRICAN AMERICAN: 58 mL/min — AB (ref >60)
Glucose, Bld: 170 mg/dL — ABNORMAL HIGH (ref 65–99)
Potassium: 4.5 mmol/L (ref 3.5–5.1)
SODIUM: 132 mmol/L — AB (ref 135–145)

## 2015-02-26 LAB — CBC
HCT: 36.9 % (ref 35.0–47.0)
Hemoglobin: 12.3 g/dL (ref 12.0–16.0)
MCH: 33.4 pg (ref 26.0–34.0)
MCHC: 33.4 g/dL (ref 32.0–36.0)
MCV: 100.1 fL — ABNORMAL HIGH (ref 80.0–100.0)
PLATELETS: 393 10*3/uL (ref 150–440)
RBC: 3.69 MIL/uL — AB (ref 3.80–5.20)
RDW: 15.4 % — ABNORMAL HIGH (ref 11.5–14.5)
WBC: 13.8 10*3/uL — AB (ref 3.6–11.0)

## 2015-02-26 LAB — LACTIC ACID, PLASMA
Lactic Acid, Venous: 3.9 mmol/L (ref 0.5–2.0)
Lactic Acid, Venous: 4 mmol/L (ref 0.5–2.0)

## 2015-02-26 LAB — MRSA PCR SCREENING: MRSA by PCR: POSITIVE — AB

## 2015-02-26 MED ORDER — PRO-STAT SUGAR FREE PO LIQD: 60.0000 mL | Freq: Every day | ORAL | Status: DC

## 2015-02-26 MED ORDER — BISACODYL 10 MG RE SUPP: 10.0000 mg | Freq: Every day | RECTAL | Status: DC | PRN

## 2015-02-26 MED ORDER — LEVOTHYROXINE SODIUM 50 MCG PO TABS
50.0000 ug | ORAL_TABLET | Freq: Every day | ORAL | Status: DC
  Administered 2015-02-26 – 2015-03-01 (×4): 50 ug via ORAL
  Filled 2015-02-26 (×5): qty 1

## 2015-02-26 MED ORDER — MUPIROCIN 2 % EX OINT
1.0000 "application " | TOPICAL_OINTMENT | Freq: Two times a day (BID) | CUTANEOUS | Status: AC
  Administered 2015-02-26 – 2015-03-02 (×10): 1 via NASAL
  Filled 2015-02-26: qty 22

## 2015-02-26 MED ORDER — INSULIN ASPART 100 UNIT/ML ~~LOC~~ SOLN: 0.0000 [IU] | Freq: Every day | SUBCUTANEOUS | Status: DC

## 2015-02-26 MED ORDER — CHLORHEXIDINE GLUCONATE CLOTH 2 % EX PADS
6.0000 | MEDICATED_PAD | Freq: Every day | CUTANEOUS | Status: DC
  Administered 2015-02-27 – 2015-03-02 (×3): 6 via TOPICAL

## 2015-02-26 MED ORDER — INSULIN ASPART 100 UNIT/ML ~~LOC~~ SOLN
0.0000 [IU] | Freq: Three times a day (TID) | SUBCUTANEOUS | Status: DC
  Administered 2015-02-26 (×2): 2 [IU] via SUBCUTANEOUS
  Filled 2015-02-26 (×3): qty 2

## 2015-02-26 MED ORDER — CARBAMAZEPINE 200 MG PO TABS
300.0000 mg | ORAL_TABLET | Freq: Four times a day (QID) | ORAL | Status: DC
  Administered 2015-02-26 – 2015-03-02 (×14): 300 mg via ORAL
  Filled 2015-02-26 (×17): qty 2

## 2015-02-26 MED ORDER — CAPSAICIN 0.025 % EX CREA
1.0000 "application " | TOPICAL_CREAM | Freq: Three times a day (TID) | CUTANEOUS | Status: DC
  Administered 2015-02-26 – 2015-03-03 (×11): 1 via TOPICAL
  Filled 2015-02-26 (×2): qty 56.6

## 2015-02-26 MED ORDER — FLUCONAZOLE 100 MG PO TABS
200.0000 mg | ORAL_TABLET | Freq: Every day | ORAL | Status: DC
  Administered 2015-02-26 – 2015-03-01 (×4): 200 mg via ORAL
  Filled 2015-02-26 (×4): qty 1

## 2015-02-26 MED ORDER — GABAPENTIN 300 MG PO CAPS
300.0000 mg | ORAL_CAPSULE | Freq: Every day | ORAL | Status: DC
  Administered 2015-02-26 – 2015-03-01 (×3): 300 mg via ORAL
  Filled 2015-02-26 (×4): qty 1

## 2015-02-26 MED ORDER — AZELASTINE HCL 0.1 % NA SOLN
2.0000 | Freq: Two times a day (BID) | NASAL | Status: DC
  Administered 2015-02-26 – 2015-03-03 (×9): 2 via NASAL
  Filled 2015-02-26: qty 30

## 2015-02-26 MED ORDER — PRO-STAT SUGAR FREE PO LIQD: 60.0000 mL | Freq: Three times a day (TID) | ORAL | Status: DC

## 2015-02-26 MED ORDER — NYSTATIN 100000 UNIT/GM EX OINT
1.0000 "application " | TOPICAL_OINTMENT | Freq: Two times a day (BID) | CUTANEOUS | Status: DC
  Administered 2015-02-26 – 2015-03-03 (×8): 1 via TOPICAL
  Filled 2015-02-26: qty 15

## 2015-02-26 MED ORDER — CALCIUM CARBONATE-VITAMIN D 500-200 MG-UNIT PO TABS
1.0000 | ORAL_TABLET | Freq: Two times a day (BID) | ORAL | Status: DC
  Administered 2015-02-26 – 2015-03-01 (×6): 1 via ORAL
  Filled 2015-02-26 (×8): qty 1

## 2015-02-26 MED ORDER — FLUTICASONE PROPIONATE 50 MCG/ACT NA SUSP
2.0000 | Freq: Every day | NASAL | Status: DC
  Administered 2015-02-28 – 2015-03-03 (×4): 2 via NASAL
  Filled 2015-02-26: qty 16

## 2015-02-26 MED ORDER — SENNA 8.6 MG PO TABS
1.0000 | ORAL_TABLET | Freq: Two times a day (BID) | ORAL | Status: DC
  Administered 2015-02-26 – 2015-03-01 (×5): 8.6 mg via ORAL
  Filled 2015-02-26 (×6): qty 1

## 2015-02-26 MED ORDER — ASPIRIN EC 81 MG PO TBEC
81.0000 mg | DELAYED_RELEASE_TABLET | Freq: Every day | ORAL | Status: DC
  Administered 2015-02-26 – 2015-03-01 (×3): 81 mg via ORAL
  Filled 2015-02-26 (×5): qty 1

## 2015-02-26 NOTE — Progress Notes (Signed)
Initial Nutrition Assessment   INTERVENTION:   Meals and Snacks: Cater to patient preferences; pt may need Carb Modified diet order as well on follow. Pt blind, requiring assistance at meal times. Medical Food Supplement Therapy: will decrease Prostat to daily as pt reports not taking usually. Will send Magic Cup BID for added nutrition and protein Coordination of Care: will recommend collecting daily weights   NUTRITION DIAGNOSIS:   Increased nutrient needs related to acute illness, wound healing as evidenced by estimated needs.  GOAL:   Patient will meet greater than or equal to 90% of their needs  MONITOR:    (Energy Intake, Electrolyte and Renal Profile, Anthropometrics, Digestive system, Skin)  REASON FOR ASSESSMENT:    (Pressure Ulcer)    ASSESSMENT:   Pt admitted with n/v off and no for 2 months s/p treatment for UTI per MD note. Pt now presenting with anasarca and sepsis. Pt is blind.  Past Medical History  Diagnosis Date  . Diabetes mellitus without complication (HCC)   . Hypertension      Diet Order:  Diet Heart Room service appropriate?: Yes; Fluid consistency:: Thin    Current Nutrition: Pt ate half of french toast, 100% of orange juice and coffee, for lunch seemingly with son.   Food/Nutrition-Related History: Pt son reports pt not tolerating food for the past 2 weeks PTA. Pt's son reports after a few bites would have n/v. Pt's son reports pt has not been eating well since moved to NH this past January as pt does not like their food and is served food without salt/fat. Pt son reports pt has done Ensure in the past but none recently. Pt reports no difficulty swallowing or chewing.   Scheduled Medications:  . aspirin EC  81 mg Oral Daily  . azelastine  2 spray Each Nare BID  . calcium-vitamin D  1 tablet Oral BID  . capsaicin  1 application Topical TID  . carbamazepine  300 mg Oral Q6H  . [START ON 02/27/2015] feeding supplement (PRO-STAT SUGAR FREE 64)   60 mL Oral Daily  . fluconazole  200 mg Oral QHS  . fluticasone  2 spray Each Nare Daily  . gabapentin  300 mg Oral QHS  . heparin  5,000 Units Subcutaneous 3 times per day  . insulin aspart  0-5 Units Subcutaneous QHS  . insulin aspart  0-9 Units Subcutaneous TID WC  . levothyroxine  50 mcg Oral QAC breakfast  . nystatin ointment  1 application Topical Q12H  . piperacillin-tazobactam (ZOSYN)  IV  3.375 g Intravenous 3 times per day  . promethazine  12.5 mg Intravenous Once  . senna  1 tablet Oral BID  . sodium chloride  3 mL Intravenous Q12H  . vancomycin  1,000 mg Intravenous Q18H    Continuous Medications:  . sodium chloride 125 mL/hr at 02/25/15 2330     Electrolyte/Renal Profile and Glucose Profile:   Recent Labs Lab 02/25/15 1820 02/26/15 0430  NA 131* 132*  K 4.6 4.5  CL 100* 102  CO2 15* 17*  BUN 52* 50*  CREATININE 1.01* 0.95  CALCIUM 7.8* 7.4*  GLUCOSE 176* 170*   Protein Profile: No results for input(s): ALBUMIN in the last 168 hours.  Gastrointestinal Profile: Last BM: unknown   Nutrition-Focused Physical Exam Findings:  Unable to complete Nutrition-Focused physical exam at this time. RD notes pt with 4+ edema per Nsg documentation.    Weight Change: Pt son reports pt weighed 230lbs in January last year  prior to moving into NH and then reports pt lost down to 160lbs by October 2016 (30% loss per report), however notes increase in weight secondary to fluid.   Skin:   (Stage II buttock pressure ulcer)   Height:   Ht Readings from Last 1 Encounters:  02/25/15 5' (1.524 m)    Weight:   Wt Readings from Last 1 Encounters:  02/26/15 219 lb 6.4 oz (99.519 kg)    Ideal Body Weight:   45kg  BMI:  Body mass index is 42.85 kg/(m^2).  Estimated Nutritional Needs:   Kcal:  using IBW of 45kg, BEE: 882kcals, TEE: (IF 1.2-1.4)(AF 1.2) 1270-1480kcals  Protein:  54-68g protein (1.2-1.5g/kg)  Fluid:  1125-1362mL of fluid (25-7mL/kg)  EDUCATION  NEEDS:   No education needs identified at this time   HIGH Care Level  Leda Quail, RD, LDN Pager [phone] Weekend/On-Call Pager [phone]

## 2015-02-26 NOTE — [place] FL2 (Signed)
[place] MEDICAID FL2 LEVEL OF CARE SCREENING TOOL     IDENTIFICATION  Patient Name: Kathy RobertsRose A [name] Birthdate: 1940/12/07 Sex: female Admission Date (Current Location): 02/25/2015  [place]  [place]  ([id number] [facility]) Facility and Address:  [facility], [address], [place], [place] [address]      Provider Number: [id number]  Attending Physician Name and Address:  Altamese DillingVaibhavkumar Vachhani, MD  Relative Name and Phone Number:       Current Level of Care: Hospital Recommended Level of Care: Skilled Nursing Facility Prior Approval Number:    Date Approved/Denied:   PASRR Number:  ( [id number] )  Discharge Plan: SNF    Current Diagnoses: Patient Active Problem List   Diagnosis Date Noted  . Sepsis (HCC) 02/25/2015    Orientation RESPIRATION BLADDER Height & Weight    Self, Place  Normal Continent 5' (152.4 cm) 219 lbs.  BEHAVIORAL SYMPTOMS/MOOD NEUROLOGICAL BOWEL NUTRITION STATUS   (none )  (none ) Continent Diet (Diet: Heart Healthy )  AMBULATORY STATUS COMMUNICATION OF NEEDS Skin   Extensive Assist Verbally PU Stage and Appropriate Care (Pressure Ulcer Stage 2: Left Buttocks )                       Personal Care Assistance Level of Assistance  Bathing, Feeding, Dressing Bathing Assistance: Limited assistance Feeding assistance: Limited assistance Dressing Assistance: Limited assistance     Functional Limitations Info  Sight, Hearing, Speech Sight Info: Impaired Hearing Info: Adequate Speech Info: Adequate    SPECIAL CARE FACTORS FREQUENCY                       Contractures      Additional Factors Info  Code Status Code Status Info:  (Full Code. )             Current Medications (02/26/2015):  This is the current hospital active medication list Current Facility-Administered Medications  Medication Dose Route Frequency Provider Last Rate Last Dose  . 0.9 %  sodium chloride infusion    Intravenous Continuous Wyatt Hasteavid K Hower, MD 125 mL/hr at 02/25/15 2330    . acetaminophen (TYLENOL) tablet 650 mg  650 mg Oral Q6H PRN Wyatt Hasteavid K Hower, MD       Or  . acetaminophen (TYLENOL) suppository 650 mg  650 mg Rectal Q6H PRN Wyatt Hasteavid K Hower, MD      . aspirin EC tablet 81 mg  81 mg Oral Daily Wyatt Hasteavid K Hower, MD      . azelastine (ASTELIN) 0.1 % nasal spray 2 spray  2 spray Each Nare BID Wyatt Hasteavid K Hower, MD   2 spray at 02/26/15 0309  . bisacodyl (DULCOLAX) suppository 10 mg  10 mg Rectal Daily PRN Wyatt Hasteavid K Hower, MD      . calcium-vitamin D (OSCAL WITH D) 500-200 MG-UNIT per tablet 1 tablet  1 tablet Oral BID Wyatt Hasteavid K Hower, MD   1 tablet at 02/26/15 0310  . capsaicin (ZOSTRIX) 0.025 % cream 1 application  1 application Topical TID Wyatt Hasteavid K Hower, MD      . carbamazepine (TEGRETOL) tablet 300 mg  300 mg Oral Q6H Wyatt Hasteavid K Hower, MD   300 mg at 02/26/15 0804  . feeding supplement (PRO-STAT SUGAR FREE 64) liquid 60 mL  60 mL Oral TID Wyatt Hasteavid K Hower, MD      . fluconazole (DIFLUCAN) tablet 200 mg  200 mg Oral QHS Wyatt Hasteavid K Hower, MD      .  fluticasone (FLONASE) 50 MCG/ACT nasal spray 2 spray  2 spray Each Nare Daily Wyatt Haste, MD      . gabapentin (NEURONTIN) capsule 300 mg  300 mg Oral QHS Wyatt Haste, MD      . heparin injection 5,000 Units  5,000 Units Subcutaneous 3 times per day Wyatt Haste, MD   5,000 Units at 02/26/15 0600  . insulin aspart (novoLOG) injection 0-5 Units  0-5 Units Subcutaneous QHS Wyatt Haste, MD      . insulin aspart (novoLOG) injection 0-9 Units  0-9 Units Subcutaneous TID WC Wyatt Haste, MD   2 Units at 02/26/15 [phone]  . levothyroxine (SYNTHROID, LEVOTHROID) tablet 50 mcg  50 mcg Oral QAC breakfast Wyatt Haste, MD   50 mcg at 02/26/15 0804  . morphine 2 MG/ML injection 2 mg  2 mg Intravenous Q4H PRN Wyatt Haste, MD      . nystatin ointment (MYCOSTATIN) 1 application  1 application Topical Q12H Wyatt Haste, MD   1 application at 02/26/15 0230  . ondansetron (ZOFRAN)  tablet 4 mg  4 mg Oral Q6H PRN Wyatt Haste, MD       Or  . ondansetron [facility]) injection 4 mg  4 mg Intravenous Q6H PRN Wyatt Haste, MD      . oxyCODONE (Oxy IR/ROXICODONE) immediate release tablet 5 mg  5 mg Oral Q4H PRN Wyatt Haste, MD      . piperacillin-tazobactam (ZOSYN) IVPB 3.375 g  3.375 g Intravenous 3 times per day Wyatt Haste, MD   3.375 g at 02/26/15 0600  . promethazine (PHENERGAN) injection 12.5 mg  12.5 mg Intravenous Once Phineas Semen, MD   12.5 mg at 02/25/15 2011  . senna (SENOKOT) tablet 8.6 mg  1 tablet Oral BID Wyatt Haste, MD   8.6 mg at 02/26/15 0310  . sodium chloride 0.9 % injection 3 mL  3 mL Intravenous Q12H Wyatt Haste, MD   3 mL at 02/25/15 2330  . vancomycin (VANCOCIN) IVPB 1000 mg/200 mL premix  1,000 mg Intravenous Q18H Wyatt Haste, MD   1,000 mg at 02/26/15 0600     Discharge Medications: Please see discharge summary for a list of discharge medications.  Relevant Imaging Results:  Relevant Lab Results:   Additional Information  (SSN: [id number])  Haig Prophet, LCSW

## 2015-02-26 NOTE — ED Notes (Signed)
Pt provided water, no other needs identified at this time

## 2015-02-26 NOTE — Clinical Social Work Note (Signed)
Clinical Social Work Assessment  Patient Details  Name: Kathy [name] MRN: [id number] Date of Birth: 08-Apr-1940  Date of referral:  02/26/15               Reason for consult:  Facility Placement, Other (Comment Required) (From [facility] )                Permission sought to share information with:  Oceanographeracility Contact Representative Permission granted to share information::  Yes, Verbal Permission Granted  Name::      Skilled Nursing Facility   Agency::   [facility]   Relationship::     Contact Information:     Housing/Transportation Living arrangements for the past 2 months:  Skilled Building surveyorursing Facility Source of Information:  Spouse Patient Interpreter Needed:  None Criminal Activity/Legal Involvement Pertinent to Current Situation/Hospitalization:  No - Comment as needed Significant Relationships:  Adult Children, Spouse, Siblings Lives with:  Facility Resident Do you feel safe going back to the place where you live?   (Unable to assess ) Need for family participation in patient care:  Yes (Comment)  Care giving concerns:  Patient is a long term care resident at [facility].    Social Worker assessment / plan: Per Transport plannerTeresa admissions [name] at [facility] patient is a long term care resident and her room # is 44. Per Kathy [name] she can return to [facility] at D/C. Clinical Social Worker (CSW) attempted to meet with patient however she was receiving a bath. Per chart patient is not alert and oriented. CSW contacted patient's husband Kathy [name] to complete assessment. Per husband patient has been at [facility] for 11 months and transitioned to long term care and has Medicaid. Per husband in January 2016 patient had issues with her legs and went to [facility] for short term rehab and insurance cut her off and she transitioned to long term care. Per husband they have 1 son Kathy [name] who lives in [place] and is very  supportive. Husband reported that KielerJeanette listed on patient's face [name] his is sister and she also provides support. Husband reported that his son is interested in moving patient to [facility]. CSW explained that a bed search can be initied however it is unlikely that a long term care Medicaid bed will be available. Husband reported that patient will return to [facility] if no other beds are available.   FL2 complete and faxed out. CSW will continue to follow and assist as needed.    Employment status:  Retired, Disabled (Comment on whether or not currently receiving Disability) Insurance information:  [place], [employer] PT Recommendations:  Not assessed at this time Information / Referral to community resources:  Skilled Nursing Facility  Patient/Family's Response to care:  Husband is interested in [facility]. Bed search started.   Patient/Family's Understanding of and Emotional Response to Diagnosis, Current Treatment, and Prognosis: Husband was pleasant throughout assessment and thanked CSW for calling.   Emotional Assessment Appearance:  Appears stated age Attitude/Demeanor/Rapport:  Unable to Assess Affect (typically observed):  Unable to Assess Orientation:  Oriented to Self, Fluctuating Orientation (Suspected and/or reported Sundowners) Alcohol / Substance use:  Not Applicable Psych involvement (Current and /or in the community):  No (Comment)  Discharge Needs  Concerns to be addressed:  Discharge Planning Concerns Readmission within the last 30 days:  No Current discharge risk:  Chronically ill Barriers to Discharge:  Continued Medical Work up   [employer]  G, LCSW 02/26/2015, 9:48 AM

## 2015-02-26 NOTE — Progress Notes (Signed)
Spoke with Dr. Elisabeth PigeonVachhani regarding continuous fluids and patient's generalized edema - MD to round on patient and place orders as needed.

## 2015-02-27 LAB — HEPATIC FUNCTION PANEL
ALK PHOS: 59 U/L (ref 38–126)
ALT: 14 U/L (ref 14–54)
AST: 17 U/L (ref 15–41)
Albumin: 1.7 g/dL — ABNORMAL LOW (ref 3.5–5.0)
BILIRUBIN TOTAL: 0.7 mg/dL (ref 0.3–1.2)
Bilirubin, Direct: <0.1 mg/dL — ABNORMAL LOW (ref 0.1–0.5)
Total Protein: 4.4 g/dL — ABNORMAL LOW (ref 6.5–8.1)

## 2015-02-27 LAB — CBC
HCT: 36.9 % (ref 35.0–47.0)
Hemoglobin: 12.3 g/dL (ref 12.0–16.0)
MCH: 33 pg (ref 26.0–34.0)
MCHC: 33.2 g/dL (ref 32.0–36.0)
MCV: 99.5 fL (ref 80.0–100.0)
Platelets: 448 10*3/uL — ABNORMAL HIGH (ref 150–440)
RBC: 3.71 MIL/uL — ABNORMAL LOW (ref 3.80–5.20)
RDW: 15.3 % — ABNORMAL HIGH (ref 11.5–14.5)
WBC: 12.1 10*3/uL — ABNORMAL HIGH (ref 3.6–11.0)

## 2015-02-27 LAB — GLUCOSE, CAPILLARY
GLUCOSE-CAPILLARY: 120 mg/dL — AB (ref 65–99)
GLUCOSE-CAPILLARY: 139 mg/dL — AB (ref 65–99)
Glucose-Capillary: 102 mg/dL — ABNORMAL HIGH (ref 65–99)
Glucose-Capillary: 141 mg/dL — ABNORMAL HIGH (ref 65–99)

## 2015-02-27 LAB — BASIC METABOLIC PANEL
Anion gap: 12 (ref 5–15)
BUN: 47 mg/dL — ABNORMAL HIGH (ref 6–20)
CO2: 19 mmol/L — ABNORMAL LOW (ref 22–32)
Calcium: 7.5 mg/dL — ABNORMAL LOW (ref 8.9–10.3)
Chloride: 101 mmol/L (ref 101–111)
Creatinine, Ser: 0.81 mg/dL (ref 0.44–1.00)
GFR calc Af Amer: >60 mL/min (ref >60)
GFR calc non Af Amer: >60 mL/min (ref >60)
Glucose, Bld: 113 mg/dL — ABNORMAL HIGH (ref 65–99)
Potassium: 4.4 mmol/L (ref 3.5–5.1)
Sodium: 132 mmol/L — ABNORMAL LOW (ref 135–145)

## 2015-02-27 LAB — LACTIC ACID, PLASMA: Lactic Acid, Venous: 1.6 mmol/L (ref 0.5–2.0)

## 2015-02-27 MED ORDER — ALBUMIN HUMAN 25 % IV SOLN
25.0000 g | Freq: Once | INTRAVENOUS | Status: AC
  Administered 2015-02-27: 25 g via INTRAVENOUS
  Filled 2015-02-27: qty 100

## 2015-02-27 NOTE — Progress Notes (Signed)
[facility] - [place] at [facility]   PATIENT NAME: Kathy [name]    MR#:  [id number]  DATE OF BIRTH:  15-Apr-1940  SUBJECTIVE:  CHIEF COMPLAINT:   Chief Complaint  Patient presents with  . Emesis    Sent from NH with UTI, and dehydration with generalized swelling.   Remained drowsy.   MRSA reported positive.   BP still on lower side.  REVIEW OF SYSTEMS:   Not able to give ROS as drowsy.  ROS  DRUG ALLERGIES:  No Known Allergies  VITALS:  Blood pressure 110/48, pulse 106, temperature 98 F (36.7 C), temperature source Oral, resp. rate 16, height 5' (1.524 m), weight 98.998 kg (218 lb 4 oz), SpO2 95 %.  PHYSICAL EXAMINATION:  GENERAL:  75 y.o.-year-old patient lying in the bed with no acute distress.  EYES: Pupils equal, round, reactive to light and accommodation. No scleral icterus.  HEENT: Head atraumatic, normocephalic. Oropharynx and nasopharynx clear. Mucosa very dry. NECK:  Supple, no jugular venous distention. No thyroid enlargement, no tenderness.  LUNGS: Normal breath sounds bilaterally, no wheezing, some crepitation. No use of accessory muscles of respiration.  CARDIOVASCULAR: S1, S2 normal. No murmurs, rubs, or gallops.  ABDOMEN: Soft, nontender,distended. Bowel sounds present. No organomegaly or mass. Severe edema present on all over body.  EXTREMITIES:severe pedal edema,no cyanosis, or clubbing.  NEUROLOGIC: drowsy, does not follow commands, moaning on stimuli.  PSYCHIATRIC: The patient is drowsy. SKIN: No obvious rash, lesion, or ulcer. Generalized edema - anasarca.  Physical Exam LABORATORY PANEL:   CBC  Recent Labs Lab 02/27/15 0530  WBC 12.1*  HGB 12.3  HCT 36.9  PLT 448*   ------------------------------------------------------------------------------------------------------------------  Chemistries   Recent Labs Lab 02/27/15 0530 02/27/15 1306  NA 132*  --   K 4.4  --   CL 101  --   CO2 19*  --   GLUCOSE 113*  --    BUN 47*  --   CREATININE 0.81  --   CALCIUM 7.5*  --   AST  --  17  ALT  --  14  ALKPHOS  --  59  BILITOT  --  0.7   ------------------------------------------------------------------------------------------------------------------  Cardiac Enzymes  Recent Labs Lab 02/25/15 1820  TROPONINI <0.03   ------------------------------------------------------------------------------------------------------------------  RADIOLOGY:  Ct Abdomen Pelvis W Contrast  02/25/2015  CLINICAL DATA:  75 year old female with abdominal pain, nausea and vomiting. EXAM: CT ABDOMEN AND PELVIS WITH CONTRAST TECHNIQUE: Multidetector CT imaging of the abdomen and pelvis was performed using the standard protocol following bolus administration of intravenous contrast. CONTRAST:  OMNIPAQUE IOHEXOL 300 MG/ML  SOLN COMPARISON:  CT dated 11/15/14 FINDINGS: There is a patchy area of consolidation with air bronchogram at the right lung base which may represent atelectasis/ scarring versus pneumonia. Clinical correlation is recommended. Small scattered bilateral pulmonary nodules again noted similar to prior study from. There is coronary vascular calcification. No intra-abdominal free air.  Small ascites, new from prior study. The gallbladder is not visualized, likely surgically absent. The liver appears unremarkable. There is moderate to severe fatty infiltration of the pancreas. The spleen and adrenal glands appear unremarkable. There is no hydronephrosis on either side. The visualized ureters and urinary bladder appear unremarkable. The uterus is grossly unremarkable. Constipation. No evidence of bowel obstruction or inflammation. The appendix is not visualized with certainty, likely surgically absent. There is aortoiliac atherosclerotic disease. No portal venous gas identified. There is no adenopathy. There is diffuse subcutaneous soft tissue stranding and anasarca.  Small pockets of air in the superficial subcutaneous  soft tissues of the left anterior pelvic wall may be related to recent injection: Trauma. Clinical correlation is recommended. No drainable fluid collection/ abscess identified. The bones are osteopenic. There are degenerative changes of the spine. No acute fracture. IMPRESSION: Small ascites diffuse mesenteric and subcutaneous soft tissue edema and anasarca, new from prior study. Clinical correlation is recommended. Constipation.  No evidence of bowel obstruction or inflammation. Right lung base atelectasis/scarring versus pneumonia. Clinical correlation recommended. Innumerable bilateral small pulmonary nodules similar to prior study. Electronically Signed   By: Elgie Collard M.D.   On: 02/25/2015 22:46   Dg Chest Portable 1 View  02/25/2015  CLINICAL DATA:  Right IJ line placement.  Initial encounter. EXAM: PORTABLE CHEST 1 VIEW COMPARISON:  Chest radiograph performed earlier today at 7:35 p.m. FINDINGS: The right IJ line is noted ending overlying the distal SVC. The lungs are hypoexpanded. Bibasilar airspace opacities likely reflect atelectasis. No pleural effusion or pneumothorax is seen. The cardiomediastinal silhouette remains normal in size. No acute osseous abnormalities are identified. IMPRESSION: 1. Right IJ line noted ending overlying the distal SVC. 2. Lungs hypoexpanded. Bibasilar airspace opacities likely reflect atelectasis. Electronically Signed   By: Roanna Raider M.D.   On: 02/25/2015 21:37   Dg Chest Port 1 View  02/25/2015  CLINICAL DATA:  75 year old female with altered mental status EXAM: PORTABLE CHEST 1 VIEW COMPARISON:  Radiograph dated 03/04/2014 FINDINGS: There are bilateral mid lung field linear atelectasis/scarring. A focal area of airspace opacity in the right lung base may also represent atelectatic changes or pneumonia. Clinical correlation is recommended. There is no significant pleural effusion or pneumothorax. The cardiac silhouette is within normal limits. The osseous  structures appear grossly unremarkable. IMPRESSION: Subsegmental right lung base atelectasis versus pneumonia. Clinical correlation and follow-up recommended. Electronically Signed   By: Elgie Collard M.D.   On: 02/25/2015 19:57    ASSESSMENT AND PLAN:   Principal Problem:   Sepsis (HCC) Active Problems:   Pressure ulcer  75 year old Caucasian female history of type 2 diabetes non-insulin-requiring as well as essential hypertension who is presenting with anasarca  1. Sepsis, meeting septic criteria by heart rate, leukocytosis present on arrival.  Source likely urinary tract infection and also pneumonia as suspected on Xray on arrival.  Broad-spectrum antibiotics including vancomycin/Zosyn and taper antibiotics when culture data returns.    Lactic acid elevated. Came down now.   BP stable after IV fluids.   As per husband- she is a full code.   MRSA screen positive- cont vanc.   Explained husband about poor prognosis. 2. Type 2 diabetes non-insulin-requiring: Hold oral agents at insulin sliding scale place on Accu-Cheks 3. Essential hypertension: Hold hypertensive agents given her relative hypotension 4. Hypothyroidism unspecified: Continue Synthroid 5. Venous thromboembolism prophylactic: Heparin subcutaneous 6. Anasarca- she was given [place] IV fluids at [facility] 3-4 ltr as no IV lines could be obtained.   Also have low albumin- suggesting severe malnutrition.   WIll give IV albumin.  All the records are reviewed and case discussed with Care Management/Social Workerr. Management plans discussed with the patient, family and they are in agreement.  CODE STATUS: full/  TOTAL TIME TAKING CARE OF THIS PATIENT: 35 minutes.  Explained to husband, sister and brother in law in room about extremely poor prognosis.   POSSIBLE D/C IN 3-4 DAYS, DEPENDING ON CLINICAL CONDITION.   Altamese Dilling M.D on 02/27/2015   Between 7am to 6pm - Pager -  [phone]  After 6pm go to [email or web address]  - password [email or web address]  [facility]  Office  [phone]  CC: Primary care physician; Dimple CaseySean A Smith, MD  Note: This dictation was prepared with Dragon dictation along with smaller phrase technology. Any transcriptional errors that result from this process are unintentional.

## 2015-02-27 NOTE — Progress Notes (Addendum)
Dr. Elisabeth PigeonVachhani aware of low BP. MD to round and place orders as needed. Pt has also been more lethargic this morning than yesterday, MD aware.

## 2015-02-27 NOTE — Progress Notes (Signed)
[facility] [place] at [facility]   PATIENT NAME: Kathy BilberryRose [name]    MR#:  [id number]  DATE OF BIRTH:  01-18-1941  SUBJECTIVE:  CHIEF COMPLAINT:   Chief Complaint  Patient presents with  . Emesis    Sent from NH with UTI, and dehydration with generalized swelling.   Remained drowsy.  REVIEW OF SYSTEMS:   Not able to give ROS as drowsy.  ROS  DRUG ALLERGIES:  No Known Allergies  VITALS:  Blood pressure 110/48, pulse 106, temperature 98 F (36.7 C), temperature source Oral, resp. rate 16, height 5' (1.524 m), weight 98.998 kg (218 lb 4 oz), SpO2 95 %.  PHYSICAL EXAMINATION:  GENERAL:  75 y.o.-year-old patient lying in the bed with no acute distress.  EYES: Pupils equal, round, reactive to light and accommodation. No scleral icterus.  HEENT: Head atraumatic, normocephalic. Oropharynx and nasopharynx clear. Mucosa very dry. NECK:  Supple, no jugular venous distention. No thyroid enlargement, no tenderness.  LUNGS: Normal breath sounds bilaterally, no wheezing, some crepitation. No use of accessory muscles of respiration.  CARDIOVASCULAR: S1, S2 normal. No murmurs, rubs, or gallops.  ABDOMEN: Soft, nontender,distended. Bowel sounds present. No organomegaly or mass. Severe edema present on all over body.  EXTREMITIES:severe pedal edema,no cyanosis, or clubbing.  NEUROLOGIC: drowsy, does not follow commands, moaning on stimuli.  PSYCHIATRIC: The patient is drowsy. SKIN: No obvious rash, lesion, or ulcer. Generalized edema - anasarca.  Physical Exam LABORATORY PANEL:   CBC  Recent Labs Lab 02/27/15 0530  WBC 12.1*  HGB 12.3  HCT 36.9  PLT 448*   ------------------------------------------------------------------------------------------------------------------  Chemistries   Recent Labs Lab 02/27/15 0530 02/27/15 1306  NA 132*  --   K 4.4  --   CL 101  --   CO2 19*  --   GLUCOSE 113*  --   BUN 47*  --   CREATININE 0.81  --   CALCIUM 7.5*   --   AST  --  17  ALT  --  14  ALKPHOS  --  59  BILITOT  --  0.7   ------------------------------------------------------------------------------------------------------------------  Cardiac Enzymes  Recent Labs Lab 02/25/15 1820  TROPONINI <0.03   ------------------------------------------------------------------------------------------------------------------  RADIOLOGY:  Ct Abdomen Pelvis W Contrast  02/25/2015  CLINICAL DATA:  [age] year old female with abdominal pain, nausea and vomiting. EXAM: CT ABDOMEN AND PELVIS WITH CONTRAST TECHNIQUE: Multidetector CT imaging of the abdomen and pelvis was performed using the standard protocol following bolus administration of intravenous contrast. CONTRAST:  100mL OMNIPAQUE IOHEXOL 300 MG/ML  SOLN COMPARISON:  CT dated 11/15/14 FINDINGS: There is a patchy area of consolidation with air bronchogram at the right lung base which may represent atelectasis/ scarring versus pneumonia. Clinical correlation is recommended. Small scattered bilateral pulmonary nodules again noted similar to prior study from. There is coronary vascular calcification. No intra-abdominal free air.  Small ascites, new from prior study. The gallbladder is not visualized, likely surgically absent. The liver appears unremarkable. There is moderate to severe fatty infiltration of the pancreas. The spleen and adrenal glands appear unremarkable. There is no hydronephrosis on either side. The visualized ureters and urinary bladder appear unremarkable. The uterus is grossly unremarkable. Constipation. No evidence of bowel obstruction or inflammation. The appendix is not visualized with certainty, likely surgically absent. There is aortoiliac atherosclerotic disease. No portal venous gas identified. There is no adenopathy. There is diffuse subcutaneous soft tissue stranding and anasarca. Small pockets of air in the superficial subcutaneous soft tissues of the  left anterior pelvic wall may be  related to recent injection: Trauma. Clinical correlation is recommended. No drainable fluid collection/ abscess identified. The bones are osteopenic. There are degenerative changes of the spine. No acute fracture. IMPRESSION: Small ascites diffuse mesenteric and subcutaneous soft tissue edema and anasarca, new from prior study. Clinical correlation is recommended. Constipation.  No evidence of bowel obstruction or inflammation. Right lung base atelectasis/scarring versus pneumonia. Clinical correlation recommended. Innumerable bilateral small pulmonary nodules similar to prior study. Electronically Signed   By: Elgie Collard M.D.   On: 02/25/2015 22:46   Dg Chest Portable 1 View  02/25/2015  CLINICAL DATA:  Right IJ line placement.  Initial encounter. EXAM: PORTABLE CHEST 1 VIEW COMPARISON:  Chest radiograph performed earlier today at 7:35 p.m. FINDINGS: The right IJ line is noted ending overlying the distal SVC. The lungs are hypoexpanded. Bibasilar airspace opacities likely reflect atelectasis. No pleural effusion or pneumothorax is seen. The cardiomediastinal silhouette remains normal in size. No acute osseous abnormalities are identified. IMPRESSION: 1. Right IJ line noted ending overlying the distal SVC. 2. Lungs hypoexpanded. Bibasilar airspace opacities likely reflect atelectasis. Electronically Signed   By: Roanna Raider M.D.   On: 02/25/2015 21:37   Dg Chest Port 1 View  02/25/2015  CLINICAL DATA:  75 year old female with altered mental status EXAM: PORTABLE CHEST 1 VIEW COMPARISON:  Radiograph dated 03/04/2014 FINDINGS: There are bilateral mid lung field linear atelectasis/scarring. A focal area of airspace opacity in the right lung base may also represent atelectatic changes or pneumonia. Clinical correlation is recommended. There is no significant pleural effusion or pneumothorax. The cardiac silhouette is within normal limits. The osseous structures appear grossly unremarkable. IMPRESSION:  Subsegmental right lung base atelectasis versus pneumonia. Clinical correlation and follow-up recommended. Electronically Signed   By: Elgie Collard M.D.   On: 02/25/2015 19:57    ASSESSMENT AND PLAN:   Principal Problem:   Sepsis (HCC) Active Problems:   Pressure ulcer  75 year old Caucasian female history of type 2 diabetes non-insulin-requiring as well as essential hypertension who is presenting with anasarca  1. Sepsis, meeting septic criteria by heart rate, leukocytosis present on arrival.  Source likely urinary tract infection Broad-spectrum antibiotics including vancomycin/Zosyn and taper antibiotics when culture data returns.    Lactic acid elevated.   BP stable after IV fluids.   As per husband- she is a full code. 2. Type 2 diabetes non-insulin-requiring: Hold oral agents at insulin sliding scale place on Accu-Cheks 3. Essential hypertension: Hold hypertensive agents given her relative hypotension 4. Hypothyroidism unspecified: Continue Synthroid 5. Venous thromboembolism prophylactic: Heparin subcutaneous   All the records are reviewed and case discussed with Care Management/Social Workerr. Management plans discussed with the patient, family and they are in agreement.  CODE STATUS: full/  TOTAL TIME TAKING CARE OF THIS PATIENT: 35 minutes.     POSSIBLE D/C IN 3-4 DAYS, DEPENDING ON CLINICAL CONDITION.   Altamese Dilling M.D on 02/27/2015   Between 7am to 6pm - Pager - [phone]  After 6pm go to [email or web address] - password [email or web address]  [facility]  Office  [phone]  CC: Primary care physician; Dimple Casey, MD  Note: This dictation was prepared with Dragon dictation along with smaller phrase technology. Any transcriptional errors that result from this process are unintentional.

## 2015-02-27 NOTE — Progress Notes (Signed)
Pt refusing morning meds and breakfast. Says she is sick and just wants to sleep right now. Will try again later, maybe pt will be more willing to cooperate when family is here.

## 2015-02-27 NOTE — Progress Notes (Signed)
Pt communicating a little more now that husband is at bedside. Complaining of stomach ache - offered suppository but patient refused. Requested something for nausea - zofran given. Pt now resting quietly with husband at bedside.  Will continue to monitor.

## 2015-02-28 LAB — GLUCOSE, CAPILLARY
GLUCOSE-CAPILLARY: 108 mg/dL — AB (ref 65–99)
Glucose-Capillary: 108 mg/dL — ABNORMAL HIGH (ref 65–99)
Glucose-Capillary: 126 mg/dL — ABNORMAL HIGH (ref 65–99)
Glucose-Capillary: 106 mg/dL — ABNORMAL HIGH (ref 65–99)

## 2015-02-28 LAB — VANCOMYCIN, TROUGH: Vancomycin Tr: 29 ug/mL (ref 10–20)

## 2015-02-28 MED ORDER — ALBUMIN HUMAN 5 % IV SOLN
25.0000 g | Freq: Once | INTRAVENOUS | Status: DC
  Filled 2015-02-28: qty 500

## 2015-02-28 MED ORDER — ENSURE ENLIVE PO LIQD
237.0000 mL | Freq: Two times a day (BID) | ORAL | Status: DC
  Administered 2015-03-01: 237 mL via ORAL

## 2015-02-28 MED ORDER — VANCOMYCIN HCL IN DEXTROSE 1-5 GM/200ML-% IV SOLN
1000.0000 mg | INTRAVENOUS | Status: DC
  Administered 2015-02-28 – 2015-03-01 (×2): 1000 mg via INTRAVENOUS
  Filled 2015-02-28 (×3): qty 200

## 2015-02-28 MED ORDER — ALBUMIN HUMAN 25 % IV SOLN
25.0000 g | Freq: Once | INTRAVENOUS | Status: AC
  Administered 2015-02-28: 25 g via INTRAVENOUS
  Filled 2015-02-28: qty 100

## 2015-02-28 NOTE — Progress Notes (Signed)
Dressing changes completed to sacrum and posterior thighs per WOC orders; InterDry applied to abdominal folds and perineal folds as well. Timed, dated and signed. Patient complained of pain to these sites and received PRN pain medication.

## 2015-02-28 NOTE — Consult Note (Signed)
WOC wound consult note Reason for Consult: Unstageable sacral pressure injury.  Moisture Associated Skin Damage (incontinence associated) IAD to left and right ischium.  Wound type:Pressure in conjunction with moisture from incontinence.  Patient has a foley catheter in at this time.  Loose stools have been reported.  Intertriginous dermatitis to abdominal pannus and groin area.  All present on admission.  Pressure Ulcer POA: Yes Measurement: generalized erythema to abdominal pannus and groin skin folds.  Orders for Nystatin cream to this area.  Wound WUJ:WJXBJYbed:Sacrum 6 cm x 4 cm x 0.2 cm with 2 cm x 2 cm area of adherent slough.  Will treat conservatively with alginate dressing (ALGISITE) to wound bed. Left ischium (and posterior thigh) 8 cm x 10 cm with scattered nonintact lesions 0.2 cm in depth.   Right ischium 4 cm x 4 cm x 0.2 cm  Drainage (amount, consistency, odor) Moderate serosanguinous drainage.  Periwound:Erythema Dressing procedure/placement/frequency:Keep skin clean and dry.  NO disposable briefs or underpads.  Begin Interdry AG to abdominal and going skin folds: Measure and cut length of InterDry Ag+ to fit in skin folds that have skin breakdown Tuck InterDry  Ag+ fabric into skin folds in a single layer, allow for 2 inches of overhang from skin edges to allow for wicking to occur May remove to bathe; dry area thoroughly and then tuck into affected areas again Do not apply any creams or ointments when using InterDry Ag+ DO NOT THROW AWAY FOR 5 DAYS unless soiled with stool DO NOT [facility] product, this will inactivate the silver in the material  New sheet of Interdry Ag+ should be applied after 5 days of use if patient continues to have skin breakdown  Cleanse ulcers to sacrum and bilateral ischium/posterior thighs with NS and pat gently dry.  Apply alginate dressing (ALGISITE) in store room- to wound bed.  Cover with 4x4 gauze and ABD pad/tape.  Change daily.  Will not follow at this  time.  Please re-consult if needed.  Maple HudsonKaren Kayl Stogdill RN BSN CWON Pager [phone]

## 2015-02-28 NOTE — Progress Notes (Signed)
Pharmacy Antibiotic Follow-up Note  Kathy RobertsRose A Elsbernd is a 75 y.o. year-old female admitted on 02/25/2015.  The patient is currently on day 4 of antibiotics for sepsis.  Assessment: Patient is currently on day #4 of antibiotics with vancomycin and piperacillin/tazobactam for sepsis. Possible sources include UTI or pneumonia. Patient was receiving ceftriaxone IM prior to admission at a nursing facility. Blood cultures show no growth at 2 days. MRSA PCR is positive.  Patient is currently prescribed:       Piperacillin/tazobactam 3.375 g IV q 8 hours       Vancomycin 1000 mg IV q 18 hours  Estimated CrCl is ~ 65 mL/min.  Vancomycin trough drawn at 11:21 today was supratherapeutic at 29 mcg/mL and was drawn appropriately. Patient is obese with a BMI of 43, and likely accumulated vancomycin.   Kinetics: ke: 0.031 Estimated half-life: ~22 hours  Vd: ~47 L  Temp (24hrs), Avg:98 F (36.7 C), Min:97.6 F (36.4 C), Max:98.3 F (36.8 C)   Recent Labs Lab 02/25/15 1820 02/26/15 0430 02/27/15 0530  WBC 14.5* 13.8* 12.1*    Recent Labs Lab 02/25/15 1820 02/26/15 0430 02/27/15 0530  CREATININE 1.01* 0.95 0.81   Estimated Creatinine Clearance: 65.4 mL/min (by C-G formula based on Cr of 0.81).    No Known Allergies  Antimicrobials this admission: Piperacillin/tazobactam 12/30 >>  vancomycin 12/30 >>   Levels/dose changes this admission: 12/30 vancomycin 1000 mg IV q 18 hours 1/02   VT of 20 mcg/mL. Dose changed to vancomycin 1000 mg IV q 24 hours  Microbiology results: 12/30 BCx: no growth at 2 days 02/28/15 UCx: Sent  12/30 MRSA PCR: Positive  Plan: Will change vancomycin to 1000 mg IV q 24 hours (start @ 1800 tonight, which is 24 hours after last dose) Vancomycin level will be obtained on 1/5 at 1730, which is prior to 4th dose to check for accumulation.  Continue piperacillin/tazobactam 3.375 g IV q 8 hours EI  Thank you for allowing pharmacy to be a part of this patient's  care.  Jodelle RedMary M Va Medical Center - [facility] Campuswayne PharmD 02/28/2015 1:01 PM

## 2015-02-28 NOTE — Progress Notes (Signed)
Pt refusing all medications and vital signs. Pt yelling "just leave her alone". Will attempt to give medications a little later.  Will continue to monitor.   Mayra NeerNesbitt, Jessly Lebeck M

## 2015-02-28 NOTE — Clinical Social Work Note (Signed)
CSW [facility]. CSW has spoken to patient's husband and he has accepted this offer stating that "this is what our son wants." CSW confirmed that it was ok with patient's husband as well.  York SpanielMonica Curlee Bogan MSW,LCSW [phone]

## 2015-02-28 NOTE — Progress Notes (Signed)
[facility] [place] at [facility]   PATIENT NAME: Kathy [name]    MR#:  [id number]  DATE OF BIRTH:  02-14-1941  SUBJECTIVE:  CHIEF COMPLAINT:   Chief Complaint  Patient presents with  . Emesis    Sent from NH with UTI, and dehydration with generalized swelling.    MRSA reported positive.   BP stable today, and she is more alert today.  as per husband- she is not eating good for long time.  REVIEW OF SYSTEMS:   CONSTITUTIONAL: No fever, positive for fatigue or weakness.  EYES: No blurred or double vision.  EARS, NOSE, AND THROAT: No tinnitus or ear pain.  RESPIRATORY: No cough, shortness of breath, wheezing or hemoptysis.  CARDIOVASCULAR: No chest pain, orthopnea, edema.  GASTROINTESTINAL: No nausea, vomiting, diarrhea or abdominal pain.  GENITOURINARY: No dysuria, hematuria.  ENDOCRINE: No polyuria, nocturia,  HEMATOLOGY: No anemia, easy bruising or bleeding SKIN: No rash or lesion. MUSCULOSKELETAL: No joint pain or arthritis.  NEUROLOGIC: No tingling, numbness, weakness.  PSYCHIATRY: No anxiety or depression.   ROS  DRUG ALLERGIES:  No Known Allergies  VITALS:  Blood pressure 102/51, pulse 88, temperature 98.2 F (36.8 C), temperature source Oral, resp. rate 18, height 5' (1.524 m), weight 96.752 kg (213 lb 4.8 oz), SpO2 96 %.  PHYSICAL EXAMINATION:  GENERAL:  75 y.o.-year-old patient lying in the bed with no acute distress.  EYES: Pupils equal, round, reactive to light. 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 HEENT: Head atraumatic, normocephalic. Oropharynx and nasopharynx clear. Mucosa very dry. NECK:  Supple, no jugular venous distention. No thyroid enlargement, no tenderness.  LUNGS: Normal breath sounds bilaterally, no wheezing, some crepitation. No use of accessory muscles of respiration.  CARDIOVASCULAR: S1, S2 normal. No murmurs, rubs, or gallops.  ABDOMEN: Soft, nontender,distended. Bowel sounds present. No organomegaly or mass. Severe  edema present on all over body.  EXTREMITIES:severe pedal edema,no cyanosis, or clubbing.  NEUROLOGIC: alert, follows simple commands, moves all limbs- but generalized weak.  PSYCHIATRIC: The patient is alert today , oriented X2. SKIN: No obvious rash, lesion, or ulcer. Generalized edema - anasarca.  Physical Exam LABORATORY PANEL:   CBC  Recent Labs Lab 02/27/15 0530  WBC 12.1*  HGB 12.3  HCT 36.9  PLT 448*   ------------------------------------------------------------------------------------------------------------------  Chemistries   Recent Labs Lab 02/27/15 0530 02/27/15 1306  NA 132*  --   K 4.4  --   CL 101  --   CO2 19*  --   GLUCOSE 113*  --   BUN 47*  --   CREATININE 0.81  --   CALCIUM 7.5*  --   AST  --  17  ALT  --  14  ALKPHOS  --  59  BILITOT  --  0.7   ------------------------------------------------------------------------------------------------------------------  Cardiac Enzymes  Recent Labs Lab 02/25/15 1820  TROPONINI <0.03   ------------------------------------------------------------------------------------------------------------------  RADIOLOGY:  No results found.  ASSESSMENT AND PLAN:   Principal Problem:   Sepsis (HCC) Active Problems:   Pressure ulcer  75 year old Caucasian female history of type 2 diabetes non-insulin-requiring as well as essential hypertension who is presenting with anasarca  1. Sepsis, meeting septic criteria by heart rate, leukocytosis present on arrival.  Source likely urinary tract infection and also pneumonia as suspected on Xray on arrival.  Broad-spectrum antibiotics including vancomycin/Zosyn and taper antibiotics when culture data returns.    Lactic acid elevated. Came down now.   BP stable after IV fluids.   As per husband- she  is a full code.   MRSA screen positive- cont vanc.   Explained husband about poor prognosis.  sent urine culture. 2. Type 2 diabetes non-insulin-requiring: Hold  oral agents at insulin sliding scale place on Accu-Cheks 3. Essential hypertension: Hold hypertensive agents given her relative hypotension 4. Hypothyroidism unspecified: Continue Synthroid 5. Venous thromboembolism prophylactic: Heparin subcutaneous 6. Anasarca- [facility] [place] [facility] as no IV lines could be obtained.   Also have low albumin- suggesting severe malnutrition.  give IV albumin.   edema slightly better now- give albumin again, encouraged ensure with diet.  All the records are reviewed and case discussed with Care Management/Social Workerr. Management plans discussed with the patient, family and they are in agreement.  CODE STATUS: full/  TOTAL TIME TAKING CARE OF THIS PATIENT: 35 minutes.  Explained husband about the plan and possible d/c in 1-2 days/   POSSIBLE D/C IN 1-2 DAYS, DEPENDING ON CLINICAL CONDITION.   Altamese DillingVACHHANI, Anna-Marie Coller M.D on 02/28/2015   Between 7am to 6pm - Pager - [phone]  After 6pm go to [email or web address] - password [email or web address]  [facility]  Office  [phone]  CC: Primary care physician; Dimple CaseySean A Smith, MD  Note: This dictation was prepared with Dragon dictation along with smaller phrase technology. Any transcriptional errors that result from this process are unintentional.

## 2015-03-01 ENCOUNTER — Inpatient Hospital Stay: Payer: Medicare HMO

## 2015-03-01 LAB — COMPREHENSIVE METABOLIC PANEL
ALK PHOS: 43 U/L (ref 38–126)
ALT: 9 U/L — AB (ref 14–54)
AST: 16 U/L (ref 15–41)
Albumin: 2.3 g/dL — ABNORMAL LOW (ref 3.5–5.0)
Anion gap: 8 (ref 5–15)
BUN: 37 mg/dL — AB (ref 6–20)
CALCIUM: 7.2 mg/dL — AB (ref 8.9–10.3)
CHLORIDE: 104 mmol/L (ref 101–111)
CO2: 20 mmol/L — AB (ref 22–32)
CREATININE: 0.8 mg/dL (ref 0.44–1.00)
GFR calc Af Amer: >60 mL/min (ref >60)
Glucose, Bld: 97 mg/dL (ref 65–99)
Potassium: 3.8 mmol/L (ref 3.5–5.1)
Sodium: 132 mmol/L — ABNORMAL LOW (ref 135–145)
Total Bilirubin: 0.7 mg/dL (ref 0.3–1.2)
Total Protein: 4.3 g/dL — ABNORMAL LOW (ref 6.5–8.1)

## 2015-03-01 LAB — GLUCOSE, CAPILLARY
GLUCOSE-CAPILLARY: 98 mg/dL (ref 65–99)
GLUCOSE-CAPILLARY: 85 mg/dL (ref 65–99)
GLUCOSE-CAPILLARY: 109 mg/dL — AB (ref 65–99)
Glucose-Capillary: 119 mg/dL — ABNORMAL HIGH (ref 65–99)

## 2015-03-01 LAB — CBC
HCT: 30.9 % — ABNORMAL LOW (ref 35.0–47.0)
HEMOGLOBIN: 10.6 g/dL — AB (ref 12.0–16.0)
MCH: 33.7 pg (ref 26.0–34.0)
MCHC: 34.2 g/dL (ref 32.0–36.0)
MCV: 98.3 fL (ref 80.0–100.0)
PLATELETS: 259 10*3/uL (ref 150–440)
RBC: 3.15 MIL/uL — AB (ref 3.80–5.20)
RDW: 15.3 % — ABNORMAL HIGH (ref 11.5–14.5)
WBC: 6.6 10*3/uL (ref 3.6–11.0)

## 2015-03-01 LAB — URINE CULTURE

## 2015-03-01 MED ORDER — FUROSEMIDE 10 MG/ML IJ SOLN
20.0000 mg | Freq: Once | INTRAMUSCULAR | Status: AC
  Administered 2015-03-01: 20 mg via INTRAVENOUS
  Filled 2015-03-01: qty 2

## 2015-03-01 MED ORDER — ENSURE ENLIVE PO LIQD: 237.0000 mL | Freq: Three times a day (TID) | ORAL | Status: DC

## 2015-03-01 MED ORDER — ENOXAPARIN SODIUM 40 MG/0.4ML ~~LOC~~ SOLN
40.0000 mg | Freq: Two times a day (BID) | SUBCUTANEOUS | Status: DC
  Administered 2015-03-01 – 2015-03-03 (×4): 40 mg via SUBCUTANEOUS
  Filled 2015-03-01 (×4): qty 0.4

## 2015-03-01 NOTE — Plan of Care (Signed)
Problem: Fluid Volume: Goal: Hemodynamic stability will improve Outcome: Progressing Patient is producing more urine since last albumin infusion. Edema to extremities has improved slightly.

## 2015-03-01 NOTE — Progress Notes (Signed)
Patient with confusion. Sleeping mostly. Multiple wounds to groin, posterior thighs, sacrum, and buttocks. Wounds cleaned with NS and dressings changed. IV fluids infusing. IV antibiotics given. Morphine and zofran given for pain and nausea.

## 2015-03-01 NOTE — Progress Notes (Signed)
[facility] -  AFB at [facility]   PATIENT NAME: Kathy [name]    MR#:  [id number]  DATE OF BIRTH:  Feb 17, 1941  SUBJECTIVE:  CHIEF COMPLAINT:   Chief Complaint  Patient presents with  . Emesis    Sent from NH with UTI, and dehydration with generalized swelling.    MRSA reported positive.   BP stable today, and she is again drowsy today.    as per husband- she is not eating good for long time.   Have decubetus ulcer also.  REVIEW OF SYSTEMS:   CONSTITUTIONAL: No fever, positive for fatigue or weakness.  EYES: No blurred or double vision.  EARS, NOSE, AND THROAT: No tinnitus or ear pain.  RESPIRATORY: No cough, shortness of breath, wheezing or hemoptysis.  CARDIOVASCULAR: No chest pain, orthopnea, edema.  GASTROINTESTINAL: No nausea, vomiting, diarrhea or abdominal pain.  GENITOURINARY: No dysuria, hematuria.  ENDOCRINE: No polyuria, nocturia,  HEMATOLOGY: No anemia, easy bruising or bleeding SKIN: No rash or lesion. MUSCULOSKELETAL: No joint pain or arthritis.  NEUROLOGIC: No tingling, numbness, have generalized weakness.  PSYCHIATRY: No anxiety or depression.   ROS  DRUG ALLERGIES:  No Known Allergies  VITALS:  Blood pressure 115/73, pulse 94, temperature 97.7 F (36.5 C), temperature source Axillary, resp. rate 18, height 5' (1.524 m), weight 97.24 kg (214 lb 6 oz), SpO2 99 %.  PHYSICAL EXAMINATION:  GENERAL:  75 y.o.-year-old patient lying in the bed with no acute distress.  EYES: Pupils equal, round, reactive to light. 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 HEENT: Head atraumatic, normocephalic. Oropharynx and nasopharynx clear. Mucosa very dry. NECK:  Supple, no jugular venous distention. No thyroid enlargement, no tenderness.  LUNGS: Normal breath sounds bilaterally, no wheezing, some crepitation. No use of accessory muscles of respiration.  CARDIOVASCULAR: S1, S2 normal. No murmurs, rubs, or gallops.  ABDOMEN: Soft, nontender,distended.  Bowel sounds present. No organomegaly or mass. Severe edema present on all over body.  EXTREMITIES:severe pedal edema,no cyanosis, or clubbing.  NEUROLOGIC: alert, follows simple commands, moves all limbs- but generalized weak.  PSYCHIATRIC: The patient is alert today , oriented X2. SKIN: No obvious rash, lesion, have decubetus sacral  ulcer. Generalized edema - anasarca.  Physical Exam LABORATORY PANEL:   CBC  Recent Labs Lab 03/01/15 0530  WBC 6.6  HGB 10.6*  HCT 30.9*  PLT 259   ------------------------------------------------------------------------------------------------------------------  Chemistries   Recent Labs Lab 03/01/15 0530  NA 132*  K 3.8  CL 104  CO2 20*  GLUCOSE 97  BUN 37*  CREATININE 0.80  CALCIUM 7.2*  AST 16  ALT 9*  ALKPHOS 43  BILITOT 0.7   ------------------------------------------------------------------------------------------------------------------  Cardiac Enzymes  Recent Labs Lab 02/25/15 1820  TROPONINI <0.03   ------------------------------------------------------------------------------------------------------------------  RADIOLOGY:  Dg Chest 2 View  03/01/2015  CLINICAL DATA:  Chest pain. EXAM: CHEST  2 VIEW COMPARISON:  02/25/2015. FINDINGS: Patient is rotated to the right. Right IJ line is in stable position. Heart size stable. Persistent bibasilar atelectasis. Interim increase in interstitial markings noted bilaterally. These changes could be related interstitial edema and/or pneumonitis. Mild right hilar fullness noted most likely related to rotation and basilar atelectasis. No pleural effusion or pneumothorax . Old left rib fractures. IMPRESSION: 1. Right IJ line in stable position. 2. Increase in interstitial markings noted bilaterally. These changes could be related to interstitial edema and/or pneumonitis. Heart size is stable. 3. Persistent low lung volumes with bibasilar atelectasis. Electronically Signed   By: Maisie Fus   Register   On:  03/01/2015 08:00    ASSESSMENT AND PLAN:   Principal Problem:   Sepsis (HCC) Active Problems:   Pressure ulcer  75 year old Caucasian female history of type 2 diabetes non-insulin-requiring as well as essential hypertension who is presenting with anasarca  1. Sepsis, meeting septic criteria by heart rate, leukocytosis, altered mental status present on arrival.  Source likely urinary tract infection and also pneumonia as suspected on Xray on arrival.  Broad-spectrum antibiotics including vancomycin/Zosyn and taper antibiotics when culture data returns.    Lactic acid elevated. Came down on follow ups.   BP stable after IV fluids.    MRSA screen positive- cont vanc.   Explained husband about poor prognosis.   sent urine culture.   As per husband- she is a full code. She has very poor appetite for long time, have decubetus ulcer.   Low albumin on presentation.    I explained him again, and asked him to consider palliative care or alternate means of feeding.   Called palliative care consult to help family making proper decision.  2. Type 2 diabetes non-insulin-requiring: Hold oral agents at insulin sliding scale place on Accu-Cheks 3. Essential hypertension: Hold hypertensive agents given her relative hypotension 4. Hypothyroidism unspecified: Continue Synthroid 5. Venous thromboembolism prophylactic: Heparin subcutaneous 6. Anasarca- she [facility] as no IV lines could be obtained.   Also have low albumin- suggesting severe malnutrition.    Given albumin 25 gm infusion twice.  All the records are reviewed and case discussed with Care Management/Social Workerr. Management plans discussed with the patient, family and they are in agreement.  CODE STATUS: full/  TOTAL TIME TAKING CARE OF THIS PATIENT: 35 minutes.  Explained husband about the plan and possible d/c in 1-2 days/   POSSIBLE D/C IN 1-2 DAYS, DEPENDING ON CLINICAL  CONDITION.   Altamese DillingVACHHANI, Yader Criger M.D on 03/01/2015   Between 7am to 6pm - Pager - [phone]  After 6pm go to [email or web address] - password [email or web address]  [facility]  [facility]  Office  [phone]  CC: Primary care physician; Dimple CaseySean A Smith, MD  Note: This dictation was prepared with Dragon dictation along with smaller phrase technology. Any transcriptional errors that result from this process are unintentional.

## 2015-03-01 NOTE — Progress Notes (Addendum)
MD notified on rounds that patient has been in a fib/junctional tachycardia and there is no documented history of this. Rate is controlled: 70s-80s. MD to assess. Also per MD, will maintain foley catheter at this time to allow PUs and MASD to posterior thighs to heal.

## 2015-03-01 NOTE — Progress Notes (Signed)
Dressings to sacrum and bilateral posterior thighs cleansed and redressed per WOC order: timed, dated and signed. InterDry placed in abdominal folds and perineal folds as well.

## 2015-03-01 NOTE — Progress Notes (Signed)
Per MD patient is not medically stable for D/C today. Plan is for [facility] to [facility]. [facility] is aware of above. CSW left patient's husband a Engineer, technical salesvoicemail. CSW will continue to follow and assist as needed.   Jetta LoutBailey Morgan, LCSWA [phone]) [phone]

## 2015-03-01 NOTE — Progress Notes (Signed)
Nutrition Follow-up     INTERVENTION:   Meals and Snacks: Cater to patient preferences,recommend liberalizing diet to Regular Medical Food Supplement Therapy: recommend increasing Ensure Enlive po to TID, each supplement provides 350 kcal and 20 grams of protein. Will discontinue Pro-stat packets as pt is not taking and pt would benefit from supplement higher in kcals such as the Ensure. Continue Magic Cup as well Feeding Assistance: continue encouragement at meal times and assistance as needed to promote po intake  NUTRITION DIAGNOSIS:   Increased nutrient needs related to acute illness, wound healing as evidenced by estimated needs.  GOAL:   Patient will meet greater than or equal to 90% of their needs  MONITOR:    (Energy Intake, Electrolyte and Renal Profile, Anthropometrics, Digestive system, Skin)  REASON FOR ASSESSMENT:    (Pressure Ulcer)    ASSESSMENT:   Pt admitted with n/v off and no for 2 months s/p treatment for UTI per MD note. Pt now presenting with anasarca and sepsis. Pt is blind.  Noted palliative care consulted today for FTT and to decide the plan about feeding, code status  Diet Order:  Heart Healthy  Energy Intake: recorded po intake 0% of meals, spoke with Maddie RN who reports pt drank 1 Ensure today but has not had anything else in the past day and a half besides water with her meds. Husband brining in outside food but pt is not eating this; not eating meal trays; refusing Pro-stat supplements  Skin:   (Stage II buttock pressure ulcer)   Electrolyte and Renal Profile:  Recent Labs Lab 02/26/15 0430 02/27/15 0530 03/01/15 0530  BUN 50* 47* 37*  CREATININE 0.95 0.81 0.80  NA 132* 132* 132*  K 4.5 4.4 3.8   Glucose Profile:  Recent Labs  02/28/15 2151 03/01/15 0728 03/01/15 1155  GLUCAP 108* 98 85   Meds: lasix, ss novolog   Height:   Ht Readings from Last 1 Encounters:  02/25/15 5' (1.524 m)    Weight:   Wt Readings from Last  1 Encounters:  03/01/15 214 lb 6 oz (97.24 kg)    Filed Weights   02/27/15 0500 02/28/15 0539 03/01/15 0600  Weight: 218 lb 4 oz (98.998 kg) 213 lb 4.8 oz (96.752 kg) 214 lb 6 oz (97.24 kg)    BMI:  Body mass index is 41.87 kg/(m^2).  Estimated Nutritional Needs:   Kcal:  using IBW of 45kg, BEE: 882kcals, TEE: (IF 1.2-1.4)(AF 1.2) 1270-1480kcals  Protein:  54-68g protein (1.2-1.5g/kg)  Fluid:  1125-131250mL of fluid (25-4430mL/kg)  EDUCATION NEEDS:   No education needs identified at this time  HIGH Care Level  Romelle Starcherate Noemy Hallmon MS, RD, LDN [phone] Pager  [phone]) [phone] Weekend/On-Call Pager

## 2015-03-02 DIAGNOSIS — N390 Urinary tract infection, site not specified: Secondary | ICD-10-CM

## 2015-03-02 DIAGNOSIS — R609 Edema, unspecified: Secondary | ICD-10-CM

## 2015-03-02 DIAGNOSIS — R630 Anorexia: Secondary | ICD-10-CM

## 2015-03-02 DIAGNOSIS — A4102 Sepsis due to Methicillin resistant Staphylococcus aureus: Secondary | ICD-10-CM

## 2015-03-02 DIAGNOSIS — E46 Unspecified protein-calorie malnutrition: Secondary | ICD-10-CM

## 2015-03-02 DIAGNOSIS — E785 Hyperlipidemia, unspecified: Secondary | ICD-10-CM

## 2015-03-02 DIAGNOSIS — Z515 Encounter for palliative care: Secondary | ICD-10-CM

## 2015-03-02 DIAGNOSIS — L89309 Pressure ulcer of unspecified buttock, unspecified stage: Secondary | ICD-10-CM

## 2015-03-02 DIAGNOSIS — E119 Type 2 diabetes mellitus without complications: Secondary | ICD-10-CM

## 2015-03-02 DIAGNOSIS — G500 Trigeminal neuralgia: Secondary | ICD-10-CM

## 2015-03-02 DIAGNOSIS — E039 Hypothyroidism, unspecified: Secondary | ICD-10-CM

## 2015-03-02 DIAGNOSIS — L89899 Pressure ulcer of other site, unspecified stage: Secondary | ICD-10-CM

## 2015-03-02 DIAGNOSIS — I4891 Unspecified atrial fibrillation: Secondary | ICD-10-CM

## 2015-03-02 DIAGNOSIS — R112 Nausea with vomiting, unspecified: Secondary | ICD-10-CM

## 2015-03-02 DIAGNOSIS — G8929 Other chronic pain: Secondary | ICD-10-CM

## 2015-03-02 DIAGNOSIS — Z87891 Personal history of nicotine dependence: Secondary | ICD-10-CM

## 2015-03-02 DIAGNOSIS — I10 Essential (primary) hypertension: Secondary | ICD-10-CM

## 2015-03-02 DIAGNOSIS — L89159 Pressure ulcer of sacral region, unspecified stage: Secondary | ICD-10-CM

## 2015-03-02 DIAGNOSIS — G9341 Metabolic encephalopathy: Secondary | ICD-10-CM

## 2015-03-02 DIAGNOSIS — M5416 Radiculopathy, lumbar region: Secondary | ICD-10-CM

## 2015-03-02 DIAGNOSIS — E669 Obesity, unspecified: Secondary | ICD-10-CM

## 2015-03-02 DIAGNOSIS — H409 Unspecified glaucoma: Secondary | ICD-10-CM

## 2015-03-02 DIAGNOSIS — E8809 Other disorders of plasma-protein metabolism, not elsewhere classified: Secondary | ICD-10-CM

## 2015-03-02 LAB — GLUCOSE, CAPILLARY
GLUCOSE-CAPILLARY: 110 mg/dL — AB (ref 65–99)
GLUCOSE-CAPILLARY: 109 mg/dL — AB (ref 65–99)
Glucose-Capillary: 130 mg/dL — ABNORMAL HIGH (ref 65–99)

## 2015-03-02 MED ORDER — FUROSEMIDE 10 MG/ML IJ SOLN
20.0000 mg | Freq: Once | INTRAMUSCULAR | Status: AC
  Administered 2015-03-02: 20 mg via INTRAVENOUS
  Filled 2015-03-02: qty 2

## 2015-03-02 MED ORDER — FUROSEMIDE 10 MG/ML IJ SOLN
20.0000 mg | Freq: Every day | INTRAMUSCULAR | Status: DC
  Administered 2015-03-02 – 2015-03-03 (×2): 20 mg via INTRAVENOUS
  Filled 2015-03-02 (×2): qty 2

## 2015-03-02 MED ORDER — ENSURE ENLIVE PO LIQD
237.0000 mL | Freq: Three times a day (TID) | ORAL | Status: DC
  Administered 2015-03-03: 237 mL via ORAL

## 2015-03-02 NOTE — Clinical Documentation Improvement (Signed)
Internal Medicine  Patient's BMI is 43.1.  Can diagnosis be provided for the BMI if appropriate for this admission?Thank you    Identify Type - morbid obesity, obesity (link the BMI to condition e.g. morbid obesity with BMI of 47), overweight, with alveolar hypoventilation (Pickwickian Syndrome)  Document etiology of - drug induced (specify drug), due to excess calories, familial, endocrine  Other  Clinically Undetermined     Please exercise your independent, professional judgment when responding. A specific answer is not anticipated or expected.   Thank You,  Lavonda JumboLawanda J Welford Christmas [facility] Information Management [place] [phone]

## 2015-03-02 NOTE — Progress Notes (Signed)
Clinical Education officer, museum (CSW) met with patient's husband, son and niece. CSW presented Hospice agencies. Family chose [facility]/ [facility]. [facility] liaison for [place]/ [place] notified. Doug admissions coordinator at [facility] is aware of above. CSW will continue to follow and assist as needed.   Blima Rich, [place] [phone]

## 2015-03-02 NOTE — Progress Notes (Signed)
Nutrition Follow-up  DOCUMENTATION CODES:   Severe malnutrition in context of chronic illness  INTERVENTION:  Meals and snacks: Discussed options with husband.  Will have kitchen chopped certain foods for pt (french toast).  Husband does not want meats chopped as does not eat much meat.   Medical Nutrition Supplement Therapy: continue ensure and magic cup   NUTRITION DIAGNOSIS:   Increased nutrient needs related to acute illness, wound healing as evidenced by estimated needs.    GOAL:   Patient will meet greater than or equal to 90% of their needs    MONITOR:    (Energy Intake, Electrolyte and Renal Profile, Anthropometrics, Digestive system, Skin)  REASON FOR ASSESSMENT:    (Pressure Ulcer)    ASSESSMENT:     Palliative care following pt.  Pl[facility] with hospice following, code status DNR Noted discontinuing some of medications that may cause GI upset   Current Nutrition: took few sips of ensure today for lunch and few bites of soup.  Poor po intake   Gastrointestinal Profile: Last BM: 1/3   Scheduled Medications:  . aspirin EC  81 mg Oral Daily  . azelastine  2 spray Each Nare BID  . capsaicin  1 application Topical TID  . carbamazepine  300 mg Oral Q6H  . Chlorhexidine Gluconate Cloth  6 each Topical Q0600  . enoxaparin (LOVENOX) injection  40 mg Subcutaneous Q12H  . feeding supplement (ENSURE ENLIVE)  237 mL Oral TID WC  . fluconazole  200 mg Oral QHS  . fluticasone  2 spray Each Nare Daily  . furosemide  20 mg Intravenous Daily  . insulin aspart  0-5 Units Subcutaneous QHS  . insulin aspart  0-9 Units Subcutaneous TID WC  . levothyroxine  50 mcg Oral QAC breakfast  . mupirocin ointment  1 application Nasal BID  . nystatin ointment  1 application Topical Q12H  . piperacillin-tazobactam (ZOSYN)  IV  3.375 g Intravenous 3 times per day  . promethazine  12.5 mg Intravenous Once  . senna  1 tablet Oral BID  . sodium  chloride  3 mL Intravenous Q12H       Electrolyte/Renal Profile and Glucose Profile:   Recent Labs Lab 02/26/15 0430 02/27/15 0530 03/01/15 0530  NA 132* 132* 132*  K 4.5 4.4 3.8  CL 102 101 104  CO2 17* 19* 20*  BUN 50* 47* 37*  CREATININE 0.95 0.81 0.80  CALCIUM 7.4* 7.5* 7.2*  GLUCOSE 170* 113* 97   Protein Profile:   Recent Labs Lab 02/27/15 1306 03/01/15 0530  ALBUMIN 1.7* 2.3*     Nutrition-Focused Physical Exam Findings: Nutrition-Focused physical exam completed. Findings are no fat depletion, mild/moderate muscle depletion, and moderate edema.      Weight Trend since Admission: Filed Weights   02/28/15 0539 03/01/15 0600 03/02/15 0605  Weight: 213 lb 4.8 oz (96.752 kg) 214 lb 6 oz (97.24 kg) 236 lb 9.6 oz (107.321 kg)      Diet Order:  Diet regular Room service appropriate?: Yes; Fluid consistency:: Thin  Skin:   (Stage II buttock pressure ulcer)  Last BM:   01/3  Height:   Ht Readings from Last 1 Encounters:  02/25/15 5' (1.524 m)    Weight:   Wt Readings from Last 1 Encounters:  03/02/15 236 lb 9.6 oz (107.321 kg)    Ideal Body Weight:     BMI:  Body mass index is 46.21 kg/(m^2).  Estimated Nutritional Needs:   Kcal:  using IBW of 45kg, BEE: 882kcals, TEE: (IF 1.2-1.4)(AF 1.2) 1270-1480kcals  Protein:  54-68g protein (1.2-1.5g/kg)  Fluid:  1125-131mL of fluid (25-28mL/kg)  EDUCATION NEEDS:   No education needs identified at this time  MODERATE Care Level  Shanique Aslinger B. Freida Busman, RD, LDN [phone] (pager) Weekend/On-Call pager [phone])

## 2015-03-02 NOTE — Care Management (Signed)
patient to return to skilled nursing facility under hospice plan of care

## 2015-03-02 NOTE — Progress Notes (Signed)
Per RN in progression rounds patient will likely not D/C today. Palliative consult pending. Clinical Social Worker (CSW) discussed case with Dr. Orvan Falconerampbell (palliative MD) and made her aware of D/C plan to [facility] under long term care [facility]. CSW will continue to follow and assist as needed.   Jetta LoutBailey Morgan, LCSWA [phone]) [phone]

## 2015-03-02 NOTE — Progress Notes (Signed)
Clinical Education officer, museum (CSW) met with patient and her husband Odis at bedside. Patient was moaning and reported that her hands were cold. Husband attempted to comfort patient by rubbing her hands. Husband reported he is concerned about patient not eating and at the nursing home patient didn't eat because she was scared she was going to get sick. Per husband patient's uncle passed away of stomach cancer. Husband reported that the 2 of patient's family members passed away in their 75's. CSW provided husband emotional support. Husband confirmed that the plan is for patient to D/C to [facility]. CSW will continue to follow and assist as needed.   Blima Rich, [place] [phone]

## 2015-03-02 NOTE — Progress Notes (Signed)
Patient foley catheter leaking, Dr. Elpidio AnisSudini in room and said ok to change out foley. Foley removed and new one placed per protocol. Dressings to posterior thighs cleansed and fresh dressings placed per WOC.

## 2015-03-02 NOTE — Consult Note (Signed)
Palliative Medicine Inpatient Consult Note   Name: Kathy [name] Date: 03/02/2015 MRN: [id number]  DOB: [birth year]  Referring Physician: Hillary Bow, MD  Palliative Care consult requested for this 75 y.o. female for goals of medical therapy in patient with UTI and multiple other conditions. She was at [facility].  She has been a nursing facility resident who had been having nausea and vomiting intermittently for about 2 moths prior to admission.  She has been treated for a UTI.  Pt was given about 3-4 Liters of fluid by clysis (subQ) --but this was at 100/hr and resulted in diffuse fluid retention.  History is listed as HTN and DM-2 but there has to be more to it than that for her to be a NH resident.  There is a plan for pt to go to [facility] as a [facility] pt --but there is much talk about her not eating well at all and therefore a discussion regarding palliative care or even hospice care in that setting is appropriate. Additionally, it is said that the pt's husband did not understand 'code status' discussion early on and I am asked to discuss this again.  She has only had one magic cup in 36 hrs reportedly (in addition to water).  Outside foods brought in --but refused and not eating tray food or Pro-stat supplements.  Has low albumin and has received albumin with some improvement in leg edema.   TODAY'S DISCUSSIONS AND DECISIONS: I met with the pt's husband and son after reviewing the chart and examining the pt.  I was able to describe code status issues in a way they could comprehend and family now agrees with DNR.  Also, they agree with talking with Hospice about Hospice in the facility ([place] --where she will be for long term care ).  Pt qualifies for Hospice care due to malnutrition.  (Albumin is 1.7 - 2.3 and oral intake is very, very poor).  Pt tells husband food makes her sick.  Will elimiinate a few Rxs that are highly suspect for GI upset and or dysmotility/  constipation (starting with Oscal and Neurontin).  Pt should not be on oral meds if they can be avoided.  She takes Tegretol for Trigeminal Neuralgia --perhaps this could be cut back over time or even eliminated as well.   Will look at her meds again before discharge to [facility]    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  SOCIAL HISTORY:  reports that she has quit smoking. She does not have any smokeless tobacco history on file. She reports that she does not drink alcohol or use illicit drugs.  LEGAL DOCUMENTS:  None --but I will place a DNR form in the paper chart   CODE STATUS: DNR --as of my meeting  with husband and son (just now)  PAST MEDICAL HISTORY: Past Medical History  Diagnosis Date  . Diabetes mellitus without complication ([place])   . Hypertension     PAST SURGICAL HISTORY: History reviewed. No pertinent past surgical history.  ALLERGIES:  has No Known Allergies.  MEDICATIONS:  Current Facility-Administered Medications  Medication Dose Route Frequency Provider Last Rate Last Dose  . acetaminophen (TYLENOL) tablet 650 mg  650  mg Oral Q6H PRN Lytle Butte, MD       Or  . acetaminophen (TYLENOL) suppository 650 mg  650 mg Rectal Q6H PRN Lytle Butte, MD      . aspirin EC tablet 81 mg  81 mg Oral Daily Lytle Butte, MD   81 mg at 03/01/15 0854  . azelastine (ASTELIN) 0.1 % nasal spray 2 spray  2 spray Each Nare BID Lytle Butte, MD   2 spray at 03/02/15 0932  . bisacodyl (DULCOLAX) suppository 10 mg  10 mg Rectal Daily PRN Lytle Butte, MD      . calcium-vitamin D (OSCAL WITH D) 500-200 MG-UNIT per tablet 1 tablet  1 tablet Oral BID Lytle Butte, MD   1 tablet at 03/01/15 2154  . capsaicin (ZOSTRIX) 5.188 % cream 1 application  1 application Topical TID Lytle Butte, MD   1 application at 41/66/06 [phone]  . carbamazepine (TEGRETOL) tablet 300 mg  300 mg Oral Q6H Lytle Butte, MD   300 mg at 03/02/15 0209  . Chlorhexidine Gluconate Cloth 2 % PADS 6 each  6 each Topical Q0600 Vaughan Basta, MD   6 each at 03/02/15 0608  . enoxaparin (LOVENOX) injection 40 mg  40 mg Subcutaneous Q12H Darylene Price [place], RPH   40 mg at 03/02/15 0935  . feeding supplement (ENSURE ENLIVE) (ENSURE ENLIVE) liquid 237 mL  237 mL Oral TID WC Vaughan Basta, MD   237 mL at 03/01/15 1718  . fluconazole (DIFLUCAN) tablet 200 mg  200 mg Oral QHS Lytle Butte, MD   200 mg at 03/01/15 2153  . fluticasone (FLONASE) 50 MCG/ACT nasal spray 2 spray  2 spray Each Nare Daily Lytle Butte, MD   2 spray at 03/02/15 0932  . furosemide (LASIX) injection 20 mg  20 mg Intravenous Daily Srikar Sudini, MD      . gabapentin (NEURONTIN) capsule 300 mg  300 mg Oral QHS Lytle Butte, MD   300 mg at 03/01/15 2152  . insulin aspart (novoLOG) injection 0-5 Units  0-5 Units Subcutaneous QHS Lytle Butte, MD   0 Units at 02/26/15 2200  . insulin aspart (novoLOG) injection 0-9 Units  0-9 Units Subcutaneous TID WC Lytle Butte, MD   2 Units at 02/26/15 1155  . levothyroxine (SYNTHROID, LEVOTHROID) tablet 50 mcg  50 mcg Oral QAC breakfast Lytle Butte, MD    50 mcg at 03/01/15 0854  . morphine 2 MG/ML injection 2 mg  2 mg Intravenous Q4H PRN Lytle Butte, MD   2 mg at 03/02/15 1243  . mupirocin ointment (BACTROBAN) 2 % 1 application  1 application Nasal BID Vaughan Basta, MD   1 application at 03/06/30 0932  . nystatin ointment (MYCOSTATIN) 1 application  1 application Topical T55D Lytle Butte, MD   1 application at 32/20/25 2154  . ondansetron (ZOFRAN) tablet 4 mg  4 mg Oral Q6H PRN Lytle Butte, MD   4 mg at 03/01/15 1721   Or  .  ondansetron (ZOFRAN) injection 4 mg  4 mg Intravenous Q6H PRN Lytle Butte, MD   4 mg at 03/01/15 1346  . oxyCODONE (Oxy IR/ROXICODONE) immediate release tablet 5 mg  5 mg Oral Q4H PRN Lytle Butte, MD   5 mg at 03/01/15 2236  . piperacillin-tazobactam (ZOSYN) IVPB 3.375 g  3.375 g Intravenous 3 times per day Lytle Butte, MD   3.375 g at 03/02/15 0608  . promethazine (PHENERGAN) injection 12.5 mg  12.5 mg Intravenous Once Nance Pear, MD   12.5 mg at 02/25/15 2011  . senna (SENOKOT) tablet 8.6 mg  1 tablet Oral BID Lytle Butte, MD   8.6 mg at 03/01/15 0854  . sodium chloride 0.9 % injection 3 mL  3 mL Intravenous Q12H Lytle Butte, MD   3 mL at 03/02/15 0934    Vital Signs: BP 108/54 mmHg  Pulse 84  Temp(Src) 97.5 F (36.4 C) (Axillary)  Resp 20  Ht 5' (1.524 m)  Wt 107.321 kg (236 lb 9.6 oz)  BMI 46.21 kg/m2  SpO2 98%  LMP  (LMP Unknown) Filed Weights   02/28/15 0539 03/01/15 0600 03/02/15 0605  Weight: 96.752 kg (213 lb 4.8 oz) 97.24 kg (214 lb 6 oz) 107.321 kg (236 lb 9.6 oz)    Estimated body mass index is 46.21 kg/(m^2) as calculated from the following:   Height as of this encounter: 5' (1.524 m).   Weight as of this encounter: 107.321 kg (236 lb 9.6 oz).  PERFORMANCE STATUS (ECOG) : 4 - Bedbound  PHYSICAL EXAM: Eyes closed, lying on her side with niece beside her trying to get some food into her Op is clear anteriorly No JVD or TM Hrt rrr with irreg beats Lungs cta ant Abd  obese and NT with nl BS Ext 2 plus edema and fat ankles noted Skin warm dry but with decubiti not viewed this visit (wound note below is reviewed in detail)   LABS: CBC:    Component Value Date/Time   WBC 6.6 03/01/2015 0530   WBC 7.0 03/06/2014 0427   HGB 10.6* 03/01/2015 0530   HGB 9.4* 03/06/2014 0427   HCT 30.9* 03/01/2015 0530   HCT 28.6* 03/06/2014 0427   PLT 259 03/01/2015 0530   PLT 173 03/06/2014 0427   MCV 98.3 03/01/2015 0530   MCV 101* 03/06/2014 0427   NEUTROABS 11.6* 02/25/2015 1820   NEUTROABS 3.8 03/06/2014 0427   LYMPHSABS 2.2 02/25/2015 1820   LYMPHSABS 1.9 03/06/2014 0427   MONOABS 0.7 02/25/2015 1820   MONOABS 0.8 03/06/2014 0427   EOSABS 0.0 02/25/2015 1820   EOSABS 0.4 03/06/2014 0427   BASOSABS 0.0 02/25/2015 1820   BASOSABS 0.1 03/06/2014 0427   Comprehensive Metabolic Panel:    Component Value Date/Time   NA 132* 03/01/2015 0530   NA 146* 03/06/2014 0427   K 3.8 03/01/2015 0530   K 3.6 03/06/2014 0427   CL 104 03/01/2015 0530   CL 111* 03/06/2014 0427   CO2 20* 03/01/2015 0530   CO2 29 03/06/2014 0427   BUN 37* 03/01/2015 0530   BUN 8 03/06/2014 0427   CREATININE 0.80 03/01/2015 0530   CREATININE 0.90 03/06/2014 0427   GLUCOSE 97 03/01/2015 0530   GLUCOSE 104* 03/06/2014 0427   CALCIUM 7.2* 03/01/2015 0530   CALCIUM 8.2* 03/06/2014 0427   AST 16 03/01/2015 0530   AST 52* 03/01/2014 0150   ALT 9* 03/01/2015 0530   ALT 40 03/01/2014 0150   ALKPHOS  43 03/01/2015 0530   ALKPHOS 170* 03/01/2014 0150   BILITOT 0.7 03/01/2015 0530   BILITOT 0.5 03/01/2014 0150   PROT 4.3* 03/01/2015 0530   PROT 6.0* 03/01/2014 0150   ALBUMIN 2.3* 03/01/2015 0530   ALBUMIN 2.2* 03/01/2014 0150   TESTS  CXR 03/01/15:: 1. Right IJ line in stable position. 2. Increase in interstitial markings noted bilaterally. These changes could be related to interstitial edema and/or pneumonitis. Heart size is stable. 3. Persistent low lung volumes with bibasilar  atelectasis.  WOUND CONSULT INFORMATION: 02/28/15:  Reason for Consult: Unstageable sacral pressure injury. Moisture Associated Skin Damage (incontinence associated) IAD to left and right ischium.  Wound type:Pressure in conjunction with moisture from incontinence. Patient has a foley catheter in at this time. Loose stools have been reported.  Intertriginous dermatitis to abdominal pannus and groin area.  All present on admission.  Pressure Ulcer POA: Yes Measurement: generalized erythema to abdominal pannus and groin skin folds. Orders for Nystatin cream to this area.  Wound KZL:DJTTSV 6 cm x 4 cm x 0.2 cm with 2 cm x 2 cm area of adherent slough. Will treat conservatively with alginate dressing (ALGISITE) to wound bed. Left ischium (and posterior thigh) 8 cm x 10 cm with scattered nonintact lesions 0.2 cm in depth.  Right ischium 4 cm x 4 cm x 0.2 cm  Drainage (amount, consistency, odor) Moderate serosanguinous drainage.  Periwound:Erythema Dressing procedure/placement/frequency:Keep skin clean and dry. NO disposable briefs or underpads.  Begin Interdry AG to abdominal and going skin folds: Measure and cut length of InterDry Ag+ to fit in skin folds that have skin breakdown Tuck InterDry Ag+ fabric into skin folds in a single layer, allow for 2 inches of overhang from skin edges to allow for wicking to occur May remove to bathe; dry area thoroughly and then tuck into affected areas again Do not apply any creams or ointments when using InterDry Ag+ DO NOT THROW AWAY FOR 5 DAYS unless soiled with stool DO NOT [facility] product, this will inactivate the silver in the material  New sheet of Interdry Ag+ should be applied after 5 days of use if patient continues to have skin breakdown  Cleanse ulcers to sacrum and bilateral ischium/posterior thighs with NS and pat gently dry. Apply alginate dressing (ALGISITE) in store room- to wound bed. Cover with 4x4 gauze and ABD pad/tape.  Change daily.         More than 50% of the visit was spent in counseling/coordination of care: Yes  Time Spent:  80 minutes

## 2015-03-02 NOTE — Progress Notes (Signed)
[facility] [place] [facility]   PATIENT NAME: Kathy [name]    MR#:  [id number]  DATE OF BIRTH:  [birth year]  SUBJECTIVE:  CHIEF COMPLAINT:   Chief Complaint  Patient presents with  . Emesis    Sent from NH with UTI, and dehydration with generalized swelling.    as per husband- she is not eating well for long time.  REVIEW OF SYSTEMS:   CONSTITUTIONAL: No fever, positive for fatigue or weakness.  EYES: No blurred or double vision.  EARS, NOSE, AND THROAT: No tinnitus or ear pain.  RESPIRATORY: No cough, shortness of breath, wheezing or hemoptysis.  CARDIOVASCULAR: No chest pain, orthopnea, edema.  GASTROINTESTINAL: No nausea, vomiting, diarrhea or abdominal pain.  GENITOURINARY: No dysuria, hematuria.  ENDOCRINE: No polyuria, nocturia,  HEMATOLOGY: No anemia, easy bruising or bleeding SKIN: No rash or lesion. MUSCULOSKELETAL: No joint pain or arthritis.  NEUROLOGIC: No tingling, numbness, have generalized weakness.  PSYCHIATRY: No anxiety or depression.   ROS  DRUG ALLERGIES:  No Known Allergies  VITALS:  Blood pressure 108/54, pulse 84, temperature 97.5 F (36.4 C), temperature source Axillary, resp. rate 20, height 5' (1.524 m), weight 107.321 kg (236 lb 9.6 oz), SpO2 98 %.  PHYSICAL EXAMINATION:  GENERAL:  75 y.o.-year-old patient lying in the bed with no acute distress.  EYES: Pupils equal, round, reactive to light. 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 HEENT: Head atraumatic, normocephalic. Oropharynx and nasopharynx clear. Mucosa very dry. NECK:  Supple, no jugular venous distention. No thyroid enlargement, no tenderness.  LUNGS: Normal breath sounds bilaterally, no wheezing, some crepitation. No use of accessory muscles of respiration.  CARDIOVASCULAR: S1, S2 normal. No murmurs, rubs, or gallops.  ABDOMEN: Soft, nontender,distended. Bowel sounds present. No organomegaly or mass. Severe edema present on all over body.  EXTREMITIES:severe  pedal edema,no cyanosis, or clubbing.  NEUROLOGIC: alert, follows simple commands, moves all limbs- but generalized weak.  PSYCHIATRIC: The patient is alert today , oriented X2. SKIN: No obvious rash, lesion, have decubitus ulcers. Generalized edema - anasarca.  Physical Exam LABORATORY PANEL:   CBC  Recent Labs Lab 03/01/15 0530  WBC 6.6  HGB 10.6*  HCT 30.9*  PLT 259   ------------------------------------------------------------------------------------------------------------------  Chemistries   Recent Labs Lab 03/01/15 0530  NA 132*  K 3.8  CL 104  CO2 20*  GLUCOSE 97  BUN 37*  CREATININE 0.80  CALCIUM 7.2*  AST 16  ALT 9*  ALKPHOS 43  BILITOT 0.7   ------------------------------------------------------------------------------------------------------------------  Cardiac Enzymes  Recent Labs Lab 02/25/15 1820  TROPONINI <0.03   ------------------------------------------------------------------------------------------------------------------  RADIOLOGY:  Dg Chest 2 View  03/01/2015  CLINICAL DATA:  Chest pain. EXAM: CHEST  2 VIEW COMPARISON:  02/25/2015. FINDINGS: Patient is rotated to the right. Right IJ line is in stable position. Heart size stable. Persistent bibasilar atelectasis. Interim increase in interstitial markings noted bilaterally. These changes could be related interstitial edema and/or pneumonitis. Mild right hilar fullness noted most likely related to rotation and basilar atelectasis. No pleural effusion or pneumothorax . Old left rib fractures. IMPRESSION: 1. Right IJ line in stable position. 2. Increase in interstitial markings noted bilaterally. These changes could be related to interstitial edema and/or pneumonitis. Heart size is stable. 3. Persistent low lung volumes with bibasilar atelectasis. Electronically Signed   By: Maisie Fus  Register   On: 03/01/2015 08:00    ASSESSMENT AND PLAN:   Principal Problem:   Sepsis (HCC) Active  Problems:   Pressure ulcer  75 year old Caucasian  female history of type 2 diabetes non-insulin-requiring as well as essential hypertension who is presenting with anasarca  1. Sepsis, meeting septic criteria by heart rate, leukocytosis, altered mental status present on arrival. Due to UTI and Pneumonia Stop vancomycin and continue zosyn.  2. Type 2 diabetes non-insulin-requiring: Hold oral agents at insulin sliding scale place on Accu-Cheks  3. Essential hypertension: Hold hypertensive agents given her relative hypotension  4. Hypothyroidism unspecified: Continue Synthroid  5. Venous thromboembolism prophylactic: Heparin subcutaneous  6. Anasarca Due to hypoalbuminemia. 1 dose IV lasix today  7. Severe malnutrition Poor oral intake.  May need PEG tube? Consulted Palliative care   All the records are reviewed and case discussed with Care Management/Social Workerr. Management plans discussed with the patient, family and they are in agreement.  CODE STATUS: full/  TOTAL TIME TAKING CARE OF THIS PATIENT: 35 minutes.   D/c 1-2 days   Milagros LollSudini, Vikram Tillett R M.D on 03/02/2015   Between 7am to 6pm - Pager - [phone]  After 6pm go to [email or web address] - password [email or web address]  Fabio Ne[facility] [place] [facility]  Office  [phone]  CC: Primary care physician; Dimple CaseySean A Smith, MD  Note: This dictation was prepared with Dragon dictation along with smaller phrase technology. Any transcriptional errors that result from this process are unintentional.

## 2015-03-02 NOTE — Progress Notes (Signed)
Patients husband and son at bedside presently. Dr. Orvan Falconerampbell paged and made aware that family is at bedside and this would be an opportune time to meet with them.

## 2015-03-03 DIAGNOSIS — N390 Urinary tract infection, site not specified: Secondary | ICD-10-CM | POA: Diagnosis present

## 2015-03-03 DIAGNOSIS — R601 Generalized edema: Secondary | ICD-10-CM | POA: Diagnosis present

## 2015-03-03 DIAGNOSIS — E43 Unspecified severe protein-calorie malnutrition: Secondary | ICD-10-CM | POA: Diagnosis present

## 2015-03-03 LAB — GLUCOSE, CAPILLARY
GLUCOSE-CAPILLARY: 172 mg/dL — AB (ref 65–99)
Glucose-Capillary: 107 mg/dL — ABNORMAL HIGH (ref 65–99)
Glucose-Capillary: 200 mg/dL — ABNORMAL HIGH (ref 65–99)

## 2015-03-03 LAB — CULTURE, BLOOD (ROUTINE X 2)
CULTURE: NO GROWTH
Culture: NO GROWTH

## 2015-03-03 MED ORDER — MORPHINE SULFATE (CONCENTRATE) 10 MG /0.5 ML PO SOLN: 5.0000 mg | ORAL | Status: AC | PRN

## 2015-03-03 MED ORDER — CARBAMAZEPINE 200 MG PO TABS: 200.0000 mg | ORAL_TABLET | Freq: Three times a day (TID) | ORAL | Status: AC

## 2015-03-03 NOTE — Progress Notes (Addendum)
New referral for Hospice and Palliative Care of Kathy [name] services at [facility] received from [place]. Kathy [name] is 75 year old woman admitted to [facility] on 02/25/15 for treatment of sepsis and dehydration. She has a PMH of DM II, HTN, CHF, Anemia, reflux, hypothyroidism, DDD, CAD, post herpetic neuralgia as well as trigeminal neuralgia. She has per report of her husband and chart note review had a poor oral intake for several weeks as well as a 2 month history of nausea. She has been treated with IV antibiotics and IV fluids, WBC now normal.she remains with very poor to no oral intake.  Pain has been treated this hospitalization with IV PRN morphine and PRN oxycodone IR 5 mg. Patient's husband and son have met with Palliative medicine physician Dr. Megan Salon and have chosen for patient to receive hospice care at [facility]. Of note patient was previously at [facility] for Long term care and family wished for her to be moved. Patient seen, lying in bed, eyes closed, responded to simple questions, oriented to self and familiars, denied pain. Breakfast tray at bedside, patient had not eaten anything, Probation officer spoke with staff aide Gerald Stabs who reports patient also refused her Ensure drink.  Writer met in the room with patient's husband Kathy [name] to initiate education regarding hospice services, philosophy and team approach to care with understanding voiced. Hospice information and contact numbers given to Mr. Clippinger, information faxed to referral. Plan is for discharge today to [facility] via EMS. Signed DNR in place on patient's chart. Hospital team updated. Thank you for the opportunity to be involved in the care of this patient. Flo Shanks, RNl BSN, [place] and Palliative Care of [place], hospital Liaison [phone] c

## 2015-03-03 NOTE — Care Management Important Message (Signed)
Important Message  Patient Details  Name: Carnella GuadalajaraRose A Yeatman MRN: [id number] Date of Birth: Dec 29, 1940   Medicare Important Message Given:  Yes    Eber HongGreene, Graysyn Bache R, RN 03/03/2015, 12:03 PM

## 2015-03-03 NOTE — Clinical Social Work Placement (Signed)
   CLINICAL SOCIAL WORK PLACEMENT  NOTE  Date:  03/03/2015  Patient Details  Name: Kathy [name] MRN: [id number] Date of Birth: 1940/06/17  Clinical Social Work is seeking post-discharge placement for this patient at the Skilled  Nursing Facility level of care (*CSW will initial, date and re-position this form in  chart as items are completed):  Yes   Patient/family provided with [facility] Clinical Social Work Department's list of facilities offering this level of care within the geographic area requested by the patient (or if unable, by the patient's family).  Yes   Patient/family informed of their freedom to choose among providers that offer the needed level of care, that participate in Medicare, Medicaid or managed care program needed by the patient, have an available bed and are willing to accept the patient.  Yes   Patient/family informed of [facility]'s ownership interest in [facility] and [facility], as well as of the fact that they are under no obligation to receive care at these facilities.  PASRR submitted to EDS on       PASRR number received on       Existing PASRR number confirmed on 02/26/15     FL2 transmitted to all facilities in geographic area requested by pt/family on 02/26/15     FL2 transmitted to all facilities within larger geographic area on       Patient informed that his/her managed care company has contracts with or will negotiate with certain facilities, including the following:        Yes   Patient/family informed of bed offers received.  Patient chooses bed at  [facility]([place])     Physician recommends and patient chooses bed at      Patient to be transferred to  [facility]([place] ) on 03/03/15.  Patient to be transferred to facility by  [facility])     Patient family notified on 03/03/15 of transfer.  Name of family member notified:   [facility](Odis Boroff )     PHYSICIAN       Additional Comment:     _______________________________________________ Idamae Lusherhristina E Makiyla Linch, LCSW 03/03/2015, 2:21 PM

## 2015-03-03 NOTE — [place] FL2 (Signed)
[place] MEDICAID FL2 LEVEL OF CARE SCREENING TOOL     IDENTIFICATION  Patient Name: Kathy [name] A Spilker Birthdate: 1940/11/22 Sex: female Admission Date (Current Location): 02/25/2015  [place]  [place]  ([id number]) Facility and Address:  [facility], [address], [place], [place] [address]      Provider Number: [id number]  Attending Physician Name and Address:  Milagros LollSrikar Sudini, MD  Relative Name and Phone Number:       Current Level of Care: Hospital Recommended Level of Care: Skilled Nursing Facility Prior Approval Number:    Date Approved/Denied:   PASRR Number:  ( [id number] )  Discharge Plan: SNF    Current Diagnoses: Patient Active Problem List   Diagnosis Date Noted  . Protein-calorie malnutrition, severe 03/03/2015  . UTI (urinary tract infection) 03/03/2015  . Anasarca 03/03/2015  . Pressure ulcer 02/26/2015  . Sepsis (HCC) 02/25/2015    Orientation RESPIRATION BLADDER Height & Weight    Self, Place  Normal Continent 5' (152.4 cm) 219 lbs.  BEHAVIORAL SYMPTOMS/MOOD NEUROLOGICAL BOWEL NUTRITION STATUS   (none )  (none ) Continent Diet (Diet: Heart Healthy )  AMBULATORY STATUS COMMUNICATION OF NEEDS Skin   Extensive Assist Verbally PU Stage and Appropriate Care (Pressure Ulcer Stage 2: Left Buttocks )                       Personal Care Assistance Level of Assistance  Bathing, Feeding, Dressing Bathing Assistance: Limited assistance Feeding assistance: Limited assistance Dressing Assistance: Limited assistance     Functional Limitations Info  Sight, Hearing, Speech Sight Info: Impaired Hearing Info: Adequate Speech Info: Adequate    SPECIAL CARE FACTORS FREQUENCY                       Contractures      Additional Factors Info  Code Status Code Status Info:  (Full Code. )             Current Medications (03/03/2015):  This is the current hospital active medication list Current  Facility-Administered Medications  Medication Dose Route Frequency Provider Last Rate Last Dose  . acetaminophen (TYLENOL) tablet 650 mg  650 mg Oral Q6H PRN Wyatt Hasteavid K Hower, MD       Or  . acetaminophen (TYLENOL) suppository 650 mg  650 mg Rectal Q6H PRN Wyatt Hasteavid K Hower, MD      . aspirin EC tablet 81 mg  81 mg Oral Daily Wyatt Hasteavid K Hower, MD   81 mg at 03/01/15 0854  . azelastine (ASTELIN) 0.1 % nasal spray 2 spray  2 spray Each Nare BID Wyatt Hasteavid K Hower, MD   2 spray at 03/02/15 2209  . bisacodyl (DULCOLAX) suppository 10 mg  10 mg Rectal Daily PRN Wyatt Hasteavid K Hower, MD      . capsaicin (ZOSTRIX) 0.025 % cream 1 application  1 application Topical TID Wyatt Hasteavid K Hower, MD   1 application at 03/02/15 2209  . carbamazepine (TEGRETOL) tablet 300 mg  300 mg Oral Q6H Wyatt Hasteavid K Hower, MD   300 mg at 03/02/15 1430  . Chlorhexidine Gluconate Cloth 2 % PADS 6 each  6 each Topical Q0600 Altamese DillingVaibhavkumar Vachhani, MD   6 each at 03/02/15 0608  . enoxaparin (LOVENOX) injection 40 mg  40 mg Subcutaneous Q12H Jodelle RedMary M Spring HillSwayne, RPH   40 mg at 03/02/15 2209  . feeding supplement (ENSURE ENLIVE) (ENSURE ENLIVE) liquid 237 mL  237 mL Oral TID  WC Srikar Sudini, MD   237 mL at 03/03/15 1200  . fluconazole (DIFLUCAN) tablet 200 mg  200 mg Oral QHS Wyatt Haste, MD   200 mg at 03/01/15 2153  . fluticasone (FLONASE) 50 MCG/ACT nasal spray 2 spray  2 spray Each Nare Daily Wyatt Haste, MD   2 spray at 03/02/15 0932  . furosemide (LASIX) injection 20 mg  20 mg Intravenous Daily Milagros Loll, MD   20 mg at 03/02/15 1701  . insulin aspart (novoLOG) injection 0-5 Units  0-5 Units Subcutaneous QHS Wyatt Haste, MD   0 Units at 02/26/15 2200  . insulin aspart (novoLOG) injection 0-9 Units  0-9 Units Subcutaneous TID WC Wyatt Haste, MD   2 Units at 02/26/15 1155  . levothyroxine (SYNTHROID, LEVOTHROID) tablet 50 mcg  50 mcg Oral QAC breakfast Wyatt Haste, MD   50 mcg at 03/01/15 0854  . morphine 2 MG/ML injection 2 mg  2 mg Intravenous Q4H PRN  Wyatt Haste, MD   2 mg at 03/02/15 1243  . nystatin ointment (MYCOSTATIN) 1 application  1 application Topical Q12H Wyatt Haste, MD   1 application at 03/02/15 2210  . ondansetron (ZOFRAN) tablet 4 mg  4 mg Oral Q6H PRN Wyatt Haste, MD   4 mg at 03/01/15 1721   Or  . ondansetron (ZOFRAN) injection 4 mg  4 mg Intravenous Q6H PRN Wyatt Haste, MD   4 mg at 03/01/15 1346  . oxyCODONE (Oxy IR/ROXICODONE) immediate release tablet 5 mg  5 mg Oral Q4H PRN Wyatt Haste, MD   5 mg at 03/01/15 2236  . piperacillin-tazobactam (ZOSYN) IVPB 3.375 g  3.375 g Intravenous 3 times per day Wyatt Haste, MD   3.375 g at 03/03/15 0600  . promethazine (PHENERGAN) injection 12.5 mg  12.5 mg Intravenous Once Phineas Semen, MD   12.5 mg at 02/25/15 2011  . senna (SENOKOT) tablet 8.6 mg  1 tablet Oral BID Wyatt Haste, MD   8.6 mg at 03/01/15 0854  . sodium chloride 0.9 % injection 3 mL  3 mL Intravenous Q12H Wyatt Haste, MD   3 mL at 03/02/15 2210     Discharge Medications: Please see discharge summary for a list of discharge medications.  Relevant Imaging Results:  Relevant Lab Results:   Additional Information  (SSN: [id number])  Haig Prophet, LCSW

## 2015-03-03 NOTE — Discharge Instructions (Signed)
°  DIET:  Regular diet  DISCHARGE CONDITION:  Fair  ACTIVITY:  Activity as tolerated  OXYGEN:  Home Oxygen: No.   Oxygen Delivery: room air  DISCHARGE LOCATION:  nursing home    HOSPICE TO FOLLOW

## 2015-03-03 NOTE — Progress Notes (Signed)
Palliative Care Update  Clarification:  No feeding tubes are desired.  This was settled during yesterday's conversation with pt's husband and son.  They are hopeful that a niece will be able to get pt to eat --but so far that isn't working either.    Hospice to follow [facility] at [facility] for  Malnutrition.    Discussed with Hospice Liaison and attending and social worker.   Portable DNR form is completed.  Cammie McgeeMargaret Ferriter Orlene Salmons, MD

## 2015-03-03 NOTE — Progress Notes (Signed)
RIJ removed per protocol, Pt tolerated procedure, tip intact. Dressing on sacral and posterior thighs changed per order. Report called to [facility] to [place] at 1650. EMS notified of discharge.

## 2015-03-03 NOTE — Discharge Summary (Addendum)
[facility] [place] [facility]   PATIENT NAME: Kathy [name]    MR#:  [id number]  DATE OF BIRTH:  1941/02/26  DATE OF ADMISSION:  02/25/2015 ADMITTING PHYSICIAN: Wyatt Haste, MD  DATE OF DISCHARGE: 03/02/2014  PRIMARY CARE PHYSICIAN: Dimple Casey, MD    ADMISSION DIAGNOSIS:  Altered mental status [R41.82] Sepsis, due to unspecified organism (HCC) [A41.9]  DISCHARGE DIAGNOSIS:  Principal Problem:   Sepsis (HCC) Active Problems:   Pressure ulcer   Protein-calorie malnutrition, severe   UTI (urinary tract infection)   Anasarca   SECONDARY DIAGNOSIS:   Past Medical History  Diagnosis Date  . Diabetes mellitus without complication (HCC)   . Hypertension      ADMITTING HISTORY  Kathy [name] is a 75 y.o. female with a known history of type 2 diabetes non-insulin-requiring as well as essential hypertension who is presenting with edema. The patient is unable to find meaningful information given mental status/medical condition history obtained from son who is present at bedside. States that the patient has been experiencing nauseousness with vomiting intermittently for approximately 2 months total duration for about 9 days in total now she's been being treated for urinary tract infection by receiving IM Rocephin as well as a general concern for dehydration so the nursing facility gave her approximately 3-4 L of subcutaneous normal saline. She is now developed diffuse anasarca thus prompting the concern presented to Hospital further workup and evaluation. Per the son he was given the options of "you can take her to hospice or the hospital"   HOSPITAL COURSE:   1. Sepsis, meeting septic criteria by heart rate, leukocytosis, altered mental status present on arrival. Due to UTI and Pneumonia Stop vancomycin and continued zosyn. Stop abx at discharge  2. Type 2 diabetes non-insulin-requiring:  Stopped metformin  3. Essential hypertension: Hold  hypertensive agents given her relative hypotension  4. Hypothyroidism unspecified: Continue Synthroid  5. Venous thromboembolism prophylactic: Heparin subcutaneous  6. Anasarca Given lasix in hospital. Poor PO intake. High risk for dehydration with lasix  7. Severe malnutrition Supplements with diet. Nom PEG as per Dr. Blair Dolphin discussion with family.  Poor prognosis. Hospice set up and discharge to SNF  Foley continued due to incontinence and proximity to her ulcers.   CONSULTS OBTAINED:  Treatment Team:  Wyatt Haste, MD  DRUG ALLERGIES:  No Known Allergies  DISCHARGE MEDICATIONS:   Current Discharge Medication List    START taking these medications   Details  Morphine Sulfate (MORPHINE CONCENTRATE) 10 mg / 0.5 ml concentrated solution Take 0.25 mLs (5 mg total) by mouth every 4 (four) hours as needed for moderate pain or severe pain. Qty: 15 mL, Refills: 0      CONTINUE these medications which have CHANGED   Details  carbamazepine (TEGRETOL) 200 MG tablet Take 1 tablet (200 mg total) by mouth 3 (three) times daily.      CONTINUE these medications which have NOT CHANGED   Details  Amino Acids-Protein Hydrolys (FEEDING SUPPLEMENT, PRO-STAT SUGAR FREE 64,) LIQD Take 60 mLs by mouth 3 (three) times daily.    amLODipine (NORVASC) 2.5 MG tablet Take 2.5 mg by mouth daily.    bisacodyl (DULCOLAX) 10 MG suppository Place 10 mg rectally daily as needed for moderate constipation.    fluticasone (FLONASE) 50 MCG/ACT nasal spray Place 2 sprays into both nostrils daily.    levothyroxine (SYNTHROID, LEVOTHROID) 50 MCG tablet Take 50 mcg by mouth daily before breakfast.  metoprolol tartrate (LOPRESSOR) 25 MG tablet Take 25 mg by mouth 2 (two) times daily.    nystatin ointment (MYCOSTATIN) Apply 1 application topically every 12 (twelve) hours. APPLY TO GROIN TOPICALLY EVERY 12 HOURS FOR FUNGAL INFECTION    promethazine (PHENERGAN) 25 MG tablet Take 25 mg by mouth  every 6 (six) hours as needed for nausea or vomiting.    senna (SENOKOT) 8.6 MG TABS tablet Take 1 tablet by mouth 2 (two) times daily.      STOP taking these medications     aspirin EC 81 MG tablet      azelastine (ASTELIN) 0.1 % nasal spray      calcium-vitamin D (OSCAL WITH D) 500-200 MG-UNIT tablet      Capsaicin (CAPSAGEL) 0.025 % GEL      cefTRIAXone (ROCEPHIN) 1 g injection      fluconazole (DIFLUCAN) 200 MG tablet      gabapentin (NEURONTIN) 300 MG capsule      HYDROmorphone (DILAUDID) 4 MG tablet      metFORMIN (GLUCOPHAGE) 850 MG tablet      Vitamin D, Ergocalciferol, (DRISDOL) 50000 units CAPS capsule          Today    VITAL SIGNS:  Blood pressure 112/63, pulse 89, temperature 97.6 F (36.4 C), temperature source Axillary, resp. rate 18, height 5' (1.524 m), weight 105.552 kg (232 lb 11.2 oz), SpO2 97 %.  I/O:   Intake/Output Summary (Last 24 hours) at 03/03/15 1131 Last data filed at 03/03/15 0659  Gross per 24 hour  Intake      0 ml  Output   2250 ml  Net  -2250 ml    PHYSICAL EXAMINATION:  Physical Exam  GENERAL:  75 y.o.-year-old patient lying in the bed with no acute distress. Obese. Pale LUNGS: Normal breath sounds bilaterally, no wheezing, rales,rhonchi or crepitation. No use of accessory muscles of respiration.  CARDIOVASCULAR: S1, S2 normal. No murmurs, rubs, or gallops.  ABDOMEN: Soft, non-tender, non-distended. Bowel sounds present. No organomegaly or mass.  NEUROLOGIC: Moves all 4 extremities. PSYCHIATRIC: The patient is alert and awake SKIN: Multiple decubitus ulcers  Ansarca  DATA REVIEW:   CBC  Recent Labs Lab 03/01/15 0530  WBC 6.6  HGB 10.6*  HCT 30.9*  PLT 259    Chemistries   Recent Labs Lab 03/01/15 0530  NA 132*  K 3.8  CL 104  CO2 20*  GLUCOSE 97  BUN 37*  CREATININE 0.80  CALCIUM 7.2*  AST 16  ALT 9*  ALKPHOS 43  BILITOT 0.7    Cardiac Enzymes  Recent Labs Lab 02/25/15 1820  TROPONINI  <0.03    Microbiology Results  Results for orders placed or performed during the hospital encounter of 02/25/15  Blood culture (routine x 2)     Status: None (Preliminary result)   Collection Time: 02/25/15  6:45 PM  Result Value Ref Range Status   Specimen Description BLOOD RIGHT ARM  Final   Special Requests BOTTLES DRAWN AEROBIC AND ANAEROBIC 4CC  Final   Culture NO GROWTH 4 DAYS  Final   Report Status PENDING  Incomplete  Blood culture (routine x 2)     Status: None (Preliminary result)   Collection Time: 02/25/15  6:55 PM  Result Value Ref Range Status   Specimen Description BLOOD RIGHT ARM  Final   Special Requests BOTTLES DRAWN AEROBIC AND ANAEROBIC 4CC  Final   Culture NO GROWTH 4 DAYS  Final   Report Status PENDING  Incomplete  MRSA PCR Screening     Status: Abnormal   Collection Time: 02/26/15 11:57 AM  Result Value Ref Range Status   MRSA by PCR POSITIVE (A) NEGATIVE Final    Comment:        The GeneXpert MRSA Assay (FDA approved for NASAL specimens only), is one component of a comprehensive MRSA colonization surveillance program. It is not intended to diagnose MRSA infection nor to guide or monitor treatment for MRSA infections. CRITICAL RESULT CALLED TO, READ BACK BY AND VERIFIED WITH: Hyman HopesAYLOR BECK RN AT [id number] 02/26/2016   Urine culture     Status: None   Collection Time: 02/28/15 11:49 AM  Result Value Ref Range Status   Specimen Description URINE, RANDOM  Final   Special Requests NONE  Final   Culture MULTIPLE SPECIES PRESENT, SUGGEST RECOLLECTION  Final   Report Status 03/01/2015 FINAL  Final  Urine culture     Status: None (Preliminary result)   Collection Time: 03/01/15  3:27 PM  Result Value Ref Range Status   Specimen Description URINE, CATHETERIZED  Final   Special Requests Normal  Final   Culture   Final    >=100,000 COLONIES/mL GRAM NEGATIVE RODS IDENTIFICATION AND SUSCEPTIBILITIES TO FOLLOW    Report Status PENDING  Incomplete    RADIOLOGY:   No results found.    Follow up with PCP in 1 week.  Management plans discussed with the patient, family and they are in agreement.  CODE STATUS:     Code Status Orders        Start     Ordered   03/03/15 1104  Do not attempt resuscitation (DNR)   Continuous    Question Answer Comment  In the event of cardiac or respiratory ARREST Do not call a "code blue"   In the event of cardiac or respiratory ARREST Do not perform Intubation, CPR, defibrillation or ACLS   In the event of cardiac or respiratory ARREST Use medication by any route, position, wound care, and other measures to relive pain and suffering. May use oxygen, suction and manual treatment of airway obstruction as needed for comfort.   Comments Also --no feeding tube      03/03/15 1104      TOTAL TIME TAKING CARE OF THIS PATIENT ON DAY OF DISCHARGE: more than 30 minutes.    Milagros LollSudini, Baden Betsch R M.D on 03/03/2015 at 11:31 AM  Between 7am to 6pm - Pager - [phone]  After 6pm go to [email or web address] - password [email or web address]  [facility]  Office  [phone]  CC: Primary care physician; Dimple CaseySean A Smith, MD     Note: This dictation was prepared with Dragon dictation along with smaller phrase technology. Any transcriptional errors that result from this process are unintentional.

## 2015-03-03 NOTE — Progress Notes (Addendum)
Clinical Social Worker was informed that patient will be medically ready to discharge to [facility] with [place] follwoing. Patient's husband at bedside and is in a agreement with plan. CSW called Gala Romneyoug, admissions coordinator at [facility] to confirm that patient's bed is ready. Provided patient's room number 201-A and number to call for report [phone]. All discharge information sent to [facility] via [facility].  Rx's added to discharge packet. RN will call report and patient will discharge to [facility]  via [facility].  CSW made Rosey Batheresa, [facility] coordinator [facility] aware that patient will not return to their facility. CSW made Clydie BraunKaren, hospice liaison that patient will discharge to [facility].

## 2015-03-04 LAB — URINE CULTURE
Culture: >=100000
SPECIAL REQUESTS: NORMAL

## 2015-03-15 LAB — GLUCOSE, CAPILLARY: GLUCOSE-CAPILLARY: 111 mg/dL — AB (ref 65–99)

## 2015-03-30 DEATH — deceased

## 2015-05-03 IMAGING — CT CT ANGIO CHEST
2 of 6 series · 18 of 36 positions shown · IV contrast (APPLIED)
Comparison: None.

CLINICAL DATA: Short breath for 1 week, wheezing. Concern pulmonary
embolism

EXAM:
CT ANGIOGRAPHY CHEST WITH CONTRAST
TECHNIQUE: Multidetector CT imaging of the chest was performed using the
standard protocol during bolus administration of intravenous
contrast. Multiplanar CT image reconstructions and MIPs were
obtained to evaluate the vascular anatomy.
CONTRAST:  100 mL Isovue

[Series 5: pe 1.0 thins · axial · 0.64mm/px · z∈[-940,-738]mm · 17 of 229 slices shown]
[im 13/229  lung]
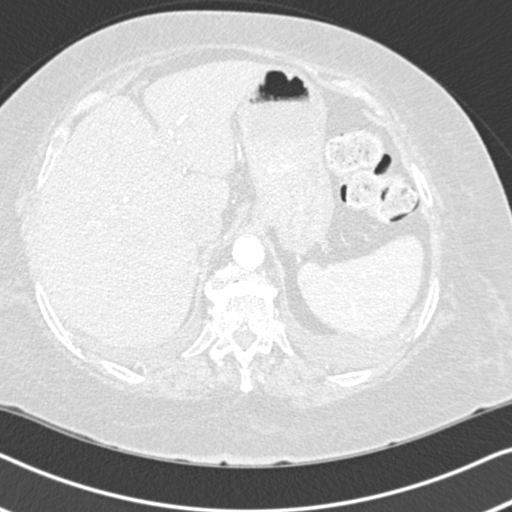
[im 26/229  mediastinal]
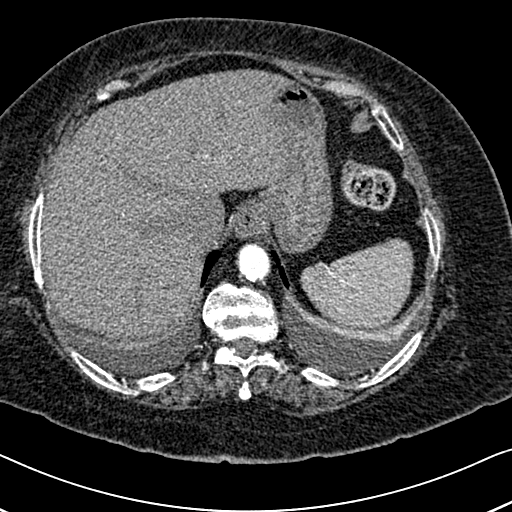
[im 39/229  lung]
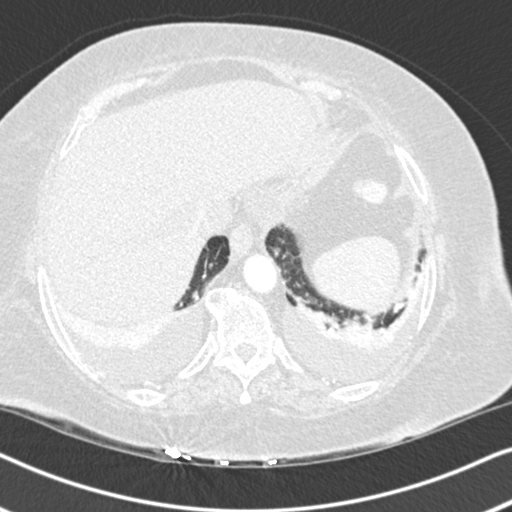
[im 51/229  mediastinal]
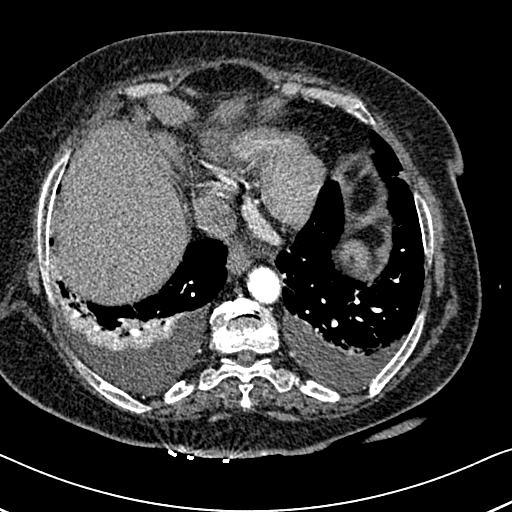
[im 64/229  lung]
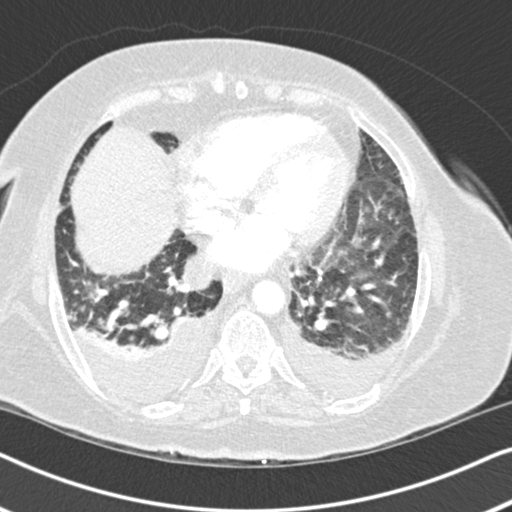
[im 77/229  mediastinal]
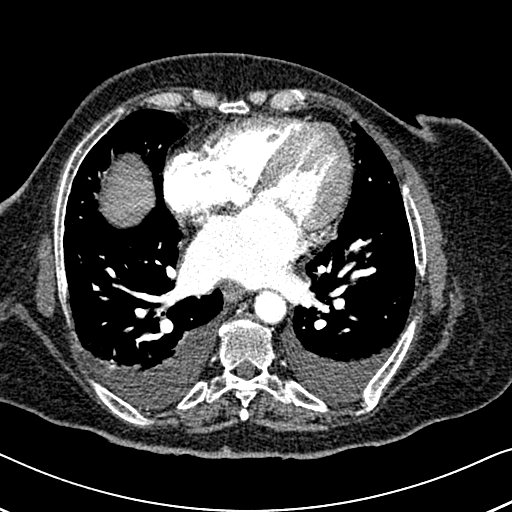
[im 89/229  lung]
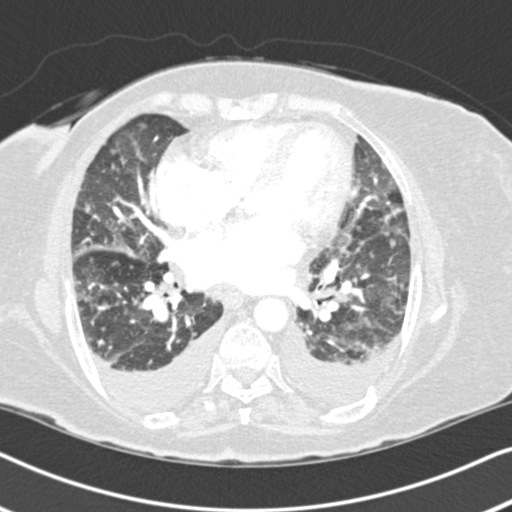
[im 102/229  mediastinal]
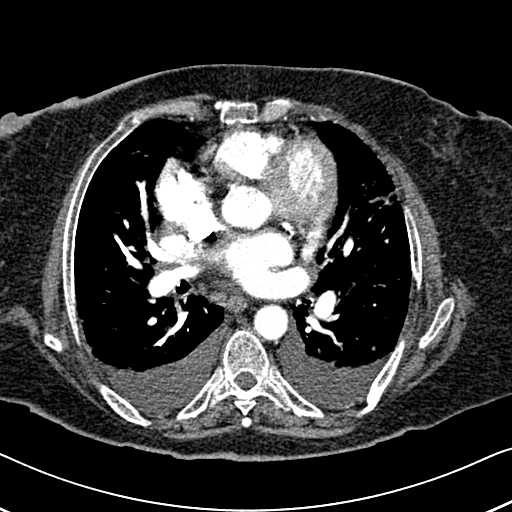
[im 115/229  lung]
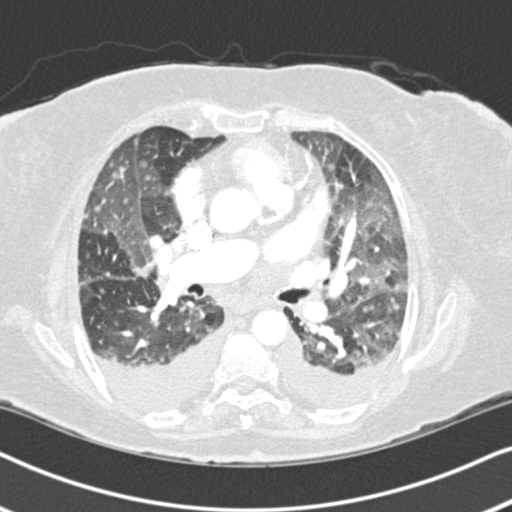
[im 127/229  mediastinal]
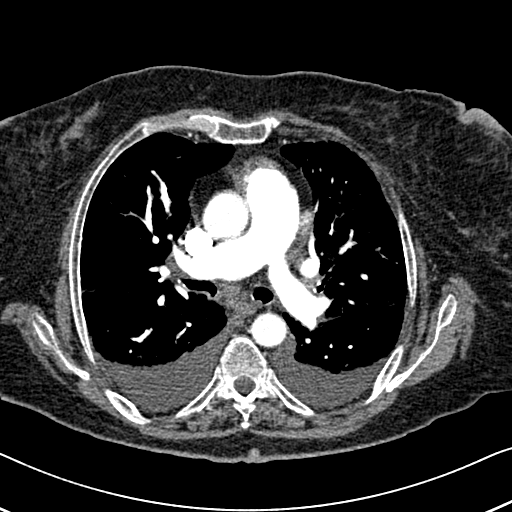
[im 140/229  lung]
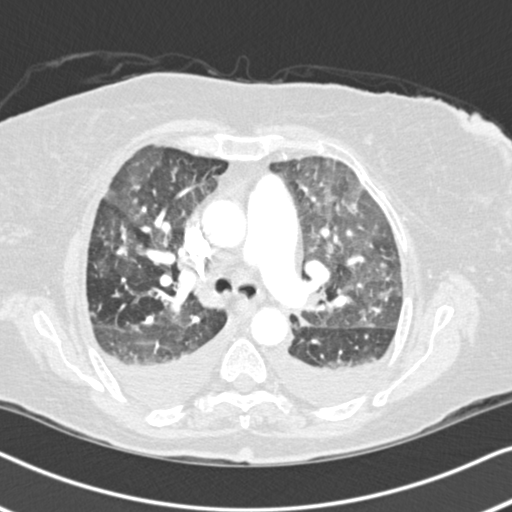
[im 153/229  mediastinal]
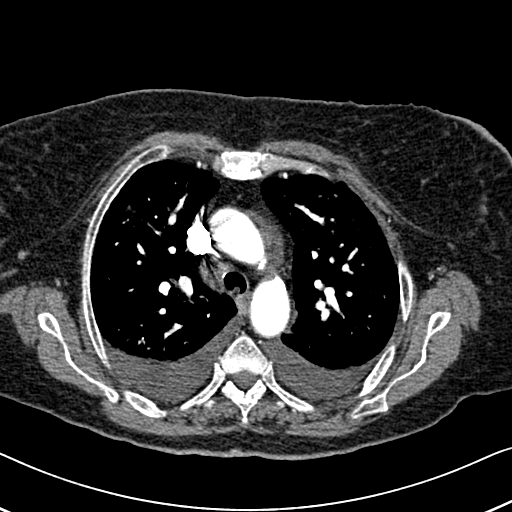
[im 165/229  lung]
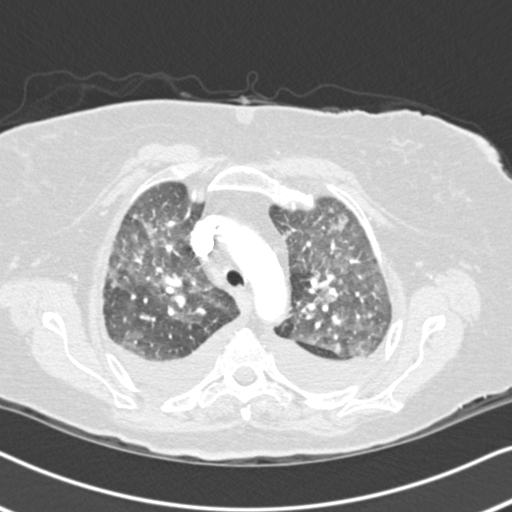
[im 178/229  mediastinal]
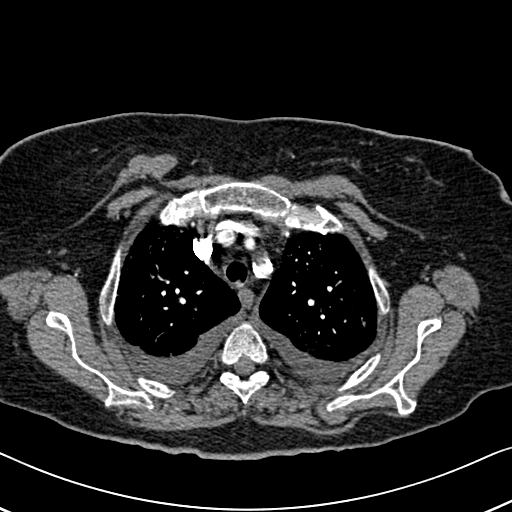
[im 191/229  lung]
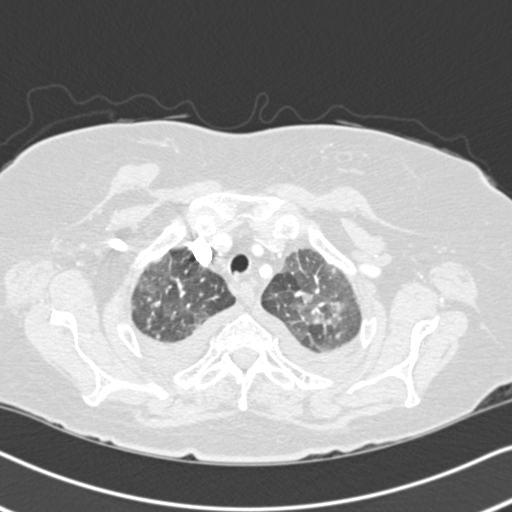
[im 203/229  mediastinal]
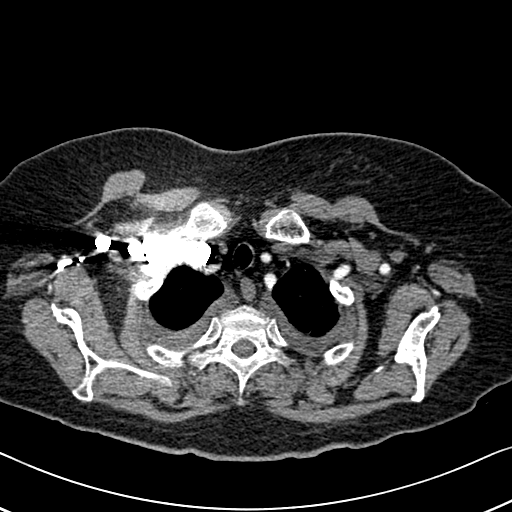
[im 216/229  lung]
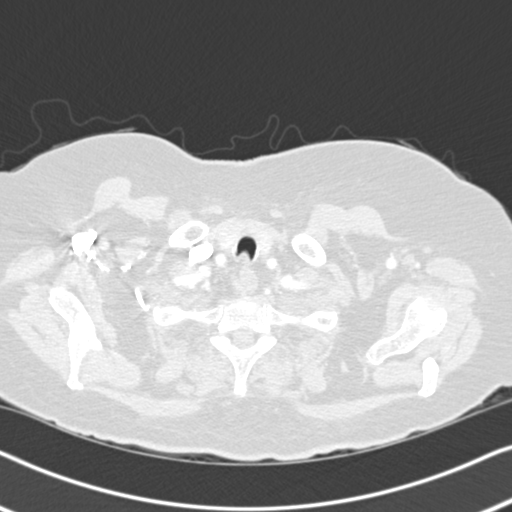

[Series 7: cor pe 2.0 mpr · coronal · 0.48mm/px · 1 of 137 slices shown]
[im 69/137  mediastinal]
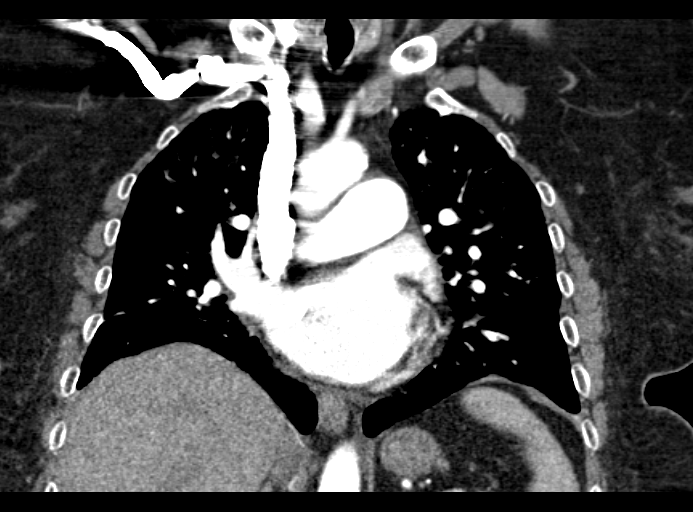

[18 of 36 positions shown; findings below may reference images not displayed]

FINDINGS: There are no filling defects within the pulmonary arteries to
suggest acute pulmonary embolism. No acute findings aorta great
vessels. No pericardial fluid. Esophagus is normal.

No axillary supraclavicular lymphadenopathy. No mediastinal
lymphadenopathy.

Review of the lung parenchyma demonstrates innumerable small
bilateral pulmonary nodules 5 mm or less. These nodules are present
on CT scan 05/15/2009 by report. There are bilateral pleural
effusions which are moderate in size. Ground-glass opacities within
the lungs in a mosaic pattern.

Limited view of the liver, kidneys, pancreas are unremarkable.
Degenerate spurring of the spine.

Review of the MIP images confirms the above findings.
IMPRESSION: 1. No acute pulmonary embolism.
2. Diffuse ground-glass passive lung suggests pulmonary edema.
3. Bilateral pleural effusions.
4. Innumerable small bilateral pulmonary nodules likely represent
noncalcified granulomas. These nodules are present by CT report of
05/15/2009.
